# Patient Record
Sex: Female | Born: 1980 | Race: Asian | Hispanic: No | Marital: Married | Smoking: Never smoker
Health system: Southern US, Community
[De-identification: ages and names within clinical notes are randomized; demographics above are authoritative.]

## PROBLEM LIST (undated history)

## (undated) HISTORY — PX: APPENDECTOMY: SHX54

## (undated) HISTORY — PX: ECTOPIC PREGNANCY SURGERY: SHX613

---

## 2014-09-14 ENCOUNTER — Telehealth: Payer: Self-pay | Admitting: Family Medicine

## 2014-09-14 NOTE — Telephone Encounter (Signed)
Patient aware that she needs to see OBGYN for pregnancy that we do not do that here.

## 2018-06-16 DIAGNOSIS — O34219 Maternal care for unspecified type scar from previous cesarean delivery: Secondary | ICD-10-CM | POA: Insufficient documentation

## 2018-10-27 LAB — HM PAP SMEAR: HM Pap smear: NEGATIVE

## 2020-03-01 ENCOUNTER — Other Ambulatory Visit: Payer: Self-pay

## 2020-03-01 ENCOUNTER — Encounter: Payer: Self-pay | Admitting: Nurse Practitioner

## 2020-03-01 ENCOUNTER — Ambulatory Visit: Payer: Medicaid Other | Admitting: Nurse Practitioner

## 2020-03-01 VITALS — BP 97/67 | HR 69 | Temp 97.5°F | Resp 20 | Ht 64.0 in | Wt 123.0 lb

## 2020-03-01 DIAGNOSIS — H9312 Tinnitus, left ear: Secondary | ICD-10-CM

## 2020-03-01 DIAGNOSIS — R42 Dizziness and giddiness: Secondary | ICD-10-CM

## 2020-03-01 DIAGNOSIS — R202 Paresthesia of skin: Secondary | ICD-10-CM | POA: Insufficient documentation

## 2020-03-01 MED ORDER — MECLIZINE HCL 25 MG PO TABS: 25.0000 mg | ORAL_TABLET | Freq: Three times a day (TID) | ORAL | 0 refills | Status: DC | PRN

## 2020-03-01 NOTE — Assessment & Plan Note (Signed)
Concerning patient's paresthesia of the arm.  Patient is reporting that symptoms are not well controlled.  This is not new for patient and has been going on for the last 1 year.  Patient is reporting worsening symptoms today.  Symptoms are numbing and tingling sensation in her left arm shoulders to her fingers and worse at night.  Patient also is reporting weakness of bilateral upper extremities.  Education provided to patient. Labs collected today: B12, CBC, CMP, TSH, and iron ferritin. Labs pending.  Treatment will follow appropriately.

## 2020-03-01 NOTE — Assessment & Plan Note (Signed)
Patient is reporting ringing in her left ear.  This is not new for patient.  Patient has had the symptoms several years ago.  Symptoms are worse since after having her child in 2020.  Patient reports fullness and worsening ringing sensation.  On assessment copious amount of earwax in patient's ear canal.  Provided education to patient to prophylactically clean ears with Debrox over-the-counter.  Provided education to patient with printed handout on cerumen buildup and care. Patient knows to follow-up with worsening or unresolved symptoms Patient started on meclizine 25 mg daily as needed.

## 2020-03-01 NOTE — Assessment & Plan Note (Signed)
Patient is a 39 year old female who presents to clinic today with episodes of dizziness.  This is not new for patient.  Patient reports experiencing 1-2 dizzy spell in every 10 to 14 days.  Patient reports symptoms are worse since the birth of her child in January 2020.  Provided education with printed handout given to patient.  Advised patient to increase fluid, stay hydrated, rise slowly and sit slowly.  Advised patient to monitor blood pressure. Patient started on meclizine 25 mg daily as needed. Medication sent to pharmacy. Patient knows to follow-up with worsening or unresolved symptoms.

## 2020-03-01 NOTE — Progress Notes (Signed)
New Patient Office Visit  Subjective:  Patient ID: Ruth Arellano, female    DOB: 26-Dec-1980  Age: 39 y.o. MRN: [id number]  CC:  Chief Complaint  Patient presents with  . Establish Care    New pt     HPI Ruth Arellano presents for Dizziness.  This is not new for patient.  Patient reports experiencing 1-2 dizzy spells every 10 to 14 days.  Patient reports symptoms are worse since the birth of her child in January 2020.  Patient has not used any medication to alleviate symptoms.  Patient is not reporting any pain or muscle aches.  Patient denies any change in activity or strenuous exercises.   Concerning patient's Paresthesia, patient is reporting that symptoms are not well controlled.  This is not new for her and has been going on for the last year.  Patient is reporting worsening symptoms today.  Symptoms are numbing and tingling sensation in her left arm, shoulders to her fingers and worse at night.  Patient also is reporting weakness of bilateral upper extremities.  Patient is reporting ringing in her left ear.  This is not new for patient, per patient this has been going on for 12 years.  Symptoms are worse since after having her child in 2020.  Patient reports fullness and worsening ringing sensation.     Past Surgical History:  Procedure Laterality Date  . APPENDECTOMY    . CESAREAN SECTION     2   . ECTOPIC PREGNANCY SURGERY      History reviewed. No pertinent family history.  Social History   Socioeconomic History  . Marital status: Married    Spouse name: Not on file  . Number of children: 2  . Years of education: Not on file  . Highest education level: Not on file  Occupational History  . Not on file  Tobacco Use  . Smoking status: Never Smoker  . Smokeless tobacco: Never Used  Vaping Use  . Vaping Use: Never used  Substance and Sexual Activity  . Alcohol use: Never  . Drug use: Never  . Sexual activity: Not on file  Other Topics Concern  . Not on file    Social History Narrative  . Not on file   Social Determinants of Health   Financial Resource Strain:   . Difficulty of Paying Living Expenses:   Food Insecurity:   . Worried About Charity fundraiser in the Last Year:   . Arboriculturist in the Last Year:   Transportation Needs:   . Film/video editor (Medical):   Marland Kitchen Lack of Transportation (Non-Medical):   Physical Activity:   . Days of Exercise per Week:   . Minutes of Exercise per Session:   Stress:   . Feeling of Stress :   Social Connections:   . Frequency of Communication with Friends and Family:   . Frequency of Social Gatherings with Friends and Family:   . Attends Religious Services:   . Active Member of Clubs or Organizations:   . Attends Archivist Meetings:   Marland Kitchen Marital Status:   Intimate Partner Violence:   . Fear of Current or Ex-Partner:   . Emotionally Abused:   Marland Kitchen Physically Abused:   . Sexually Abused:     ROS Review of Systems  Constitutional: Negative.   HENT: Negative.        Wax build up bilateral ear  Eyes: Negative.   Respiratory: Negative.   Cardiovascular: Negative.  Gastrointestinal: Negative.   Genitourinary: Negative.   Musculoskeletal: Negative.   Skin: Negative for color change and rash.  Neurological: Positive for dizziness.    Objective:   Today's Vitals: BP 97/67   Pulse 69   Temp (!) 97.5 F (36.4 C)   Resp 20   Ht _0  (1.626 m)   Wt 123 lb (55.8 kg)   LMP 02/21/2020 (Exact Date)   SpO2 98%   BMI 21.11 kg/m   Physical Exam Constitutional:      Appearance: Normal appearance.  HENT:     Head: Normocephalic.     Comments: Copious wax build up, not impacted    Nose: Nose normal.  Eyes:     Conjunctiva/sclera: Conjunctivae normal.  Cardiovascular:     Rate and Rhythm: Normal rate and regular rhythm.     Pulses: Normal pulses.     Heart sounds: Normal heart sounds.  Pulmonary:     Effort: Pulmonary effort is normal.     Breath sounds: Normal breath  sounds.  Abdominal:     General: Bowel sounds are normal.  Skin:    General: Skin is warm.     Findings: No erythema or rash.  Neurological:     Mental Status: She is alert and oriented to person, place, and time.     Comments: Dizziness, ringing in left ear  Psychiatric:        Mood and Affect: Mood normal.        Behavior: Behavior normal.     Assessment & Plan:   Problem List Items Addressed This Visit      Other   Episode of dizziness    Patient is a 39 year old female who presents to clinic today with episodes of dizziness.  This is not new for patient.  Patient reports experiencing 1-2 dizzy spell in every 10 to 14 days.  Patient reports symptoms are worse since the birth of her child in January 2020.  Provided education with printed handout given to patient.  Advised patient to increase fluid, stay hydrated, rise slowly and sit slowly.  Advised patient to monitor blood pressure. Patient started on meclizine 25 mg daily as needed. Medication sent to pharmacy. Patient knows to follow-up with worsening or unresolved symptoms.      Relevant Medications   meclizine (ANTIVERT) 25 MG tablet   Other Relevant Orders   CBC with Differential   CMP14+EGFR   Tinnitus of left ear    Patient is reporting ringing in her left ear.  This is not new for patient.  Patient has had the symptoms several years ago.  Symptoms are worse since after having her child in 2020.  Patient reports fullness and worsening ringing sensation.  On assessment copious amount of earwax in patient's ear canal.  Provided education to patient to prophylactically clean ears with Debrox over-the-counter.  Provided education to patient with printed handout on cerumen buildup and care. Patient knows to follow-up with worsening or unresolved symptoms Patient started on meclizine 25 mg daily as needed.      Paresthesia of arm - Primary    Concerning patient's paresthesia of the arm.  Patient is reporting that symptoms  are not well controlled.  This is not new for patient and has been going on for the last 1 year.  Patient is reporting worsening symptoms today.  Symptoms are numbing and tingling sensation in her left arm shoulders to her fingers and worse at night.  Patient also is reporting weakness of  bilateral upper extremities.  Education provided to patient. Labs collected today: B12, CBC, CMP, TSH, and iron ferritin. Labs pending.  Treatment will follow appropriately.      Relevant Orders   Vitamin B12   TSH   Ferritin      Outpatient Encounter Medications as of 03/01/2020  Medication Sig  . meclizine (ANTIVERT) 25 MG tablet Take 1 tablet (25 mg total) by mouth 3 (three) times daily as needed for dizziness.   No facility-administered encounter medications on file as of 03/01/2020.    Follow-up: Return if symptoms worsen or fail to improve.   Ivy Lynn, NP

## 2020-03-01 NOTE — Patient Instructions (Addendum)
Meniere Disease  Meniere disease is an inner ear disorder. It causes attacks of a spinning sensation (vertigo), dizziness, and ringing in the ear (tinnitus). It also causes hearing loss and a feeling of fullness or pressure in the ear. This is a lifelong condition, and it may get worse over time. You may have drop attacks or severe dizziness that makes you fall. A drop attack is when you suddenly fall without losing consciousness and you quickly recover after a few seconds or minutes. What are the causes? This condition is caused by having too much of the fluid that is in your inner ear (endolymph). When fluid builds up in your inner ear, it affects the nerves that control balance and hearing. The reason for the fluid buildup is not known. Possible causes include:  Allergies.  An abnormal reaction of the body's defense system (autoimmune disease).  Viral infection of the inner ear.  Head injury. What increases the risk? You are more likely to develop this condition if:  You are older than age 40.  You have a family history of Meniere disease.  You have a history of autoimmune disease.  You have a history of migraine headaches. What are the signs or symptoms? Symptoms of this condition can come and go and may last for up to 4 hours at a time. Symptoms usually start in one ear. They may become more frequent and eventually involve both ears. Symptoms can include:  Fullness and pressure in your ear.  Roaring or ringing in your ear.  Vertigo and loss of balance.  Dizziness.  Decreased hearing.  Nausea and vomiting. How is this diagnosed? This condition is diagnosed based on:  A physical exam.  Tests , such as: ? A hearing test (audiogram). ? An electronystagmogram. This tests your balance nerve (vestibular nerve). ? Imaging studies of your inner ear, such as CT scan or MRI. ? Other balance tests, such as rotational or balance platform tests. How is this treated? There is  no cure for this condition, but treatment can help to manage your symptoms. Treatment may include:  A low-salt diet. Limiting salt may help to reduce fluid in the body and relieve symptoms.  Oral or injected medicines to reduce or control: ? Vertigo. ? Nausea. ? Fluid retention. ? Dizziness.  Use of an air pressure pulse generator. This is a machine that sends small pressure pulses into your ear canal.  Hearing aids.  Inner ear surgery. This is rare. When you have symptoms, it can be helpful to lie down on a flat surface and focus your eyes on one object that does not move. Try to stay in that position until your symptoms go away. Follow these instructions at home: Eating and drinking  Eat the same amount of food at the same time every day, including snacks.  Do not skip meals.  Avoid caffeine.  Drink enough fluids to keep your urine clear or pale yellow.  Limit alcoholic drinks to one drink a day for non-pregnant women and 2 drinks a day for men. One drink equals 12 oz of beer, 5 oz of wine, or 1 oz of hard liquor.  Limit the salt (sodium) in your diet as told by your health care provider. Check ingredients and nutrition facts on packaged foods and beverages.  Do not eat foods that contain monosodium glutamate (MSG). General instructions  Do not use any products that contain nicotine or tobacco, such as cigarettes and e-cigarettes. If you need help quitting, ask your   health care provider.  Take over-the-counter and prescription medicines only as told by your health care provider.  Find ways to reduce or avoid stress. If you need help with this, ask your health care provider.  Do not drive if you have vertigo or dizziness. Contact a health care provider if:  You have symptoms that last longer than 4 hours.  You have new or worse symptoms. Get help right away if:  You have been vomiting for 24 hours.  You cannot keep fluids down.  You have chest pain or trouble  breathing. Summary  Meniere disease is an inner ear disorder. It causes attacks of a spinning sensation (vertigo), dizziness, and ringing in the ear (tinnitus). It also causes hearing loss and a feeling of fullness or pressure in the ear.  Symptoms of this condition can come and go and may last for up to 4 hours at a time.  When you have symptoms, it can be helpful to lie down on a flat surface and focus your eyes on one object that does not move. Try to stay in that position until your symptoms go away. This information is not intended to replace advice given to you by your health care provider. Make sure you discuss any questions you have with your health care provider. Document Revised: 08/02/2017 Document Reviewed: 07/11/2016 Elsevier Patient Education  2020 [employer] Dizziness Dizziness is a common problem. It makes you feel unsteady or light-headed. You may feel like you are about to pass out (faint). Dizziness can lead to getting hurt if you stumble or fall. Dizziness can be caused by many things, including:  Medicines.  Not having enough water in your body (dehydration).  Illness. Follow these instructions at home: Eating and drinking   Drink enough fluid to keep your pee (urine) clear or pale yellow. This helps to keep you from getting dehydrated. Try to drink more clear fluids, such as water.  Do not drink alcohol.  Limit how much caffeine you drink or eat, if your doctor tells you to do that.  Limit how much salt (sodium) you drink or eat, if your doctor tells you to do that. Activity   Avoid making quick movements. ? When you stand up from sitting in a chair, steady yourself until you feel okay. ? In the morning, first sit up on the side of the bed. When you feel okay, stand slowly while you hold onto something. Do this until you know that your balance is fine.  If you need to stand in one place for a long time, move your legs often. Tighten and relax the muscles  in your legs while you are standing.  Do not drive or use heavy machinery if you feel dizzy.  Avoid bending down if you feel dizzy. Place items in your home so you can reach them easily without leaning over. Lifestyle  Do not use any products that contain nicotine or tobacco, such as cigarettes and e-cigarettes. If you need help quitting, ask your doctor.  Try to lower your stress level. You can do this by using methods such as yoga or meditation. Talk with your doctor if you need help. General instructions  Watch your dizziness for any changes.  Take over-the-counter and prescription medicines only as told by your doctor. Talk with your doctor if you think that you are dizzy because of a medicine that you are taking.  Tell a friend or a family member that you are feeling dizzy. If he or  she notices any changes in your behavior, have this person call your doctor.  Keep all follow-up visits as told by your doctor. This is important. Contact a doctor if:  Your dizziness does not go away.  Your dizziness or light-headedness gets worse.  You feel sick to your stomach (nauseous).  You have trouble hearing.  You have new symptoms.  You are unsteady on your feet.  You feel like the room is spinning. Get help right away if:  You throw up (vomit) or have watery poop (diarrhea), and you cannot eat or drink anything.  You have trouble: ? Talking. ? Walking. ? Swallowing. ? Using your arms, hands, or legs.  You feel generally weak.  You are not thinking clearly, or you have trouble forming sentences. A friend or family member may notice this.  You have: ? Chest pain. ? Pain in your belly (abdomen). ? Shortness of breath. ? Sweating.  Your vision changes.  You are bleeding.  You have a very bad headache.  You have neck pain or a stiff neck.  You have a fever. These symptoms may be an emergency. Do not wait to see if the symptoms will go away. Get medical help right  away. Call your local emergency services (911 in the [place] Do not drive yourself to the hospital. Summary  Dizziness makes you feel unsteady or light-headed. You may feel like you are about to pass out (faint).  Drink enough fluid to keep your pee (urine) clear or pale yellow. Do not drink alcohol.  Avoid making quick movements if you feel dizzy.  Watch your dizziness for any changes. This information is not intended to replace advice given to you by your health care provider. Make sure you discuss any questions you have with your health care provider. Document Revised: 08/23/2017 Document Reviewed: 09/06/2016 Elsevier Patient Education  2020 [employer] Paresthesia Paresthesia is a burning or prickling feeling. This feeling can happen in any part of the body. It often happens in the hands, arms, legs, or feet. Usually, it is not painful. In most cases, the feeling goes away in a short time and is not a sign of a serious problem. If you have paresthesia that lasts a long time, you may need to be seen by your doctor. Follow these instructions at home: Alcohol use   Do not drink alcohol if: ? Your doctor tells you not to drink. ? You are pregnant, may be pregnant, or are planning to become pregnant.  If you drink alcohol: ? Limit how much you use to:  0-1 drink a day for women.  0-2 drinks a day for men. ? Be aware of how much alcohol is in your drink. In the [place] one drink equals one 12 oz bottle of beer (355 mL), one 5 oz glass of wine (148 mL), or one 1 oz glass of hard liquor (44 mL). Nutrition   Eat a healthy diet. This includes: ? Eating foods that have a lot of fiber in them, such as fresh fruits and vegetables, whole grains, and beans. ? Limiting foods that have a lot of fat and processed sugars in them, such as fried or sweet foods. General instructions  Take over-the-counter and prescription medicines only as told by your doctor.  Do not use any products that  have nicotine or tobacco in them, such as cigarettes and e-cigarettes. If you need help quitting, ask your doctor.  If you have diabetes, work with your doctor to make sure  your blood sugar stays in a healthy range.  If your feet feel numb: ? Check for redness, warmth, and swelling every day. ? Wear padded socks and comfortable shoes. These help protect your feet.  Keep all follow-up visits as told by your doctor. This is important. Contact a doctor if:  You have paresthesia that gets worse or does not go away.  Your burning or prickling feeling gets worse when you walk.  You have pain or cramps.  You feel dizzy.  You have a rash. Get help right away if you:  Feel weak.  Have trouble walking or moving.  Have problems speaking, understanding, or seeing.  Feel confused.  Cannot control when you pee (urinate) or poop (have a bowel movement).  Lose feeling (have numbness) after an injury.  Have new weakness in an arm or leg.  Pass out (faint). Summary  Paresthesia is a burning or prickling feeling. It often happens in the hands, arms, legs, or feet.  In most cases, the feeling goes away in a short time and is not a sign of a serious problem.  If you have paresthesia that lasts a long time, you may need to be seen by your doctor. This information is not intended to replace advice given to you by your health care provider. Make sure you discuss any questions you have with your health care provider. Document Revised: 09/15/2018 Document Reviewed: 08/29/2017 Elsevier Patient Education  2020 [employer].

## 2020-03-02 LAB — VITAMIN B12: Vitamin B-12: 897 pg/mL (ref 232–1245)

## 2020-03-02 LAB — CBC WITH DIFFERENTIAL/PLATELET
Basophils Absolute: 0 10*3/uL (ref 0.0–0.2)
Basos: 1 %
EOS (ABSOLUTE): 0.1 10*3/uL (ref 0.0–0.4)
Eos: 2 %
Hematocrit: 38.2 % (ref 34.0–46.6)
Hemoglobin: 12.8 g/dL (ref 11.1–15.9)
Immature Grans (Abs): 0 10*3/uL (ref 0.0–0.1)
Immature Granulocytes: 0 %
Lymphocytes Absolute: 2.1 10*3/uL (ref 0.7–3.1)
Lymphs: 48 %
MCH: 31.4 pg (ref 26.6–33.0)
MCHC: 33.5 g/dL (ref 31.5–35.7)
MCV: 94 fL (ref 79–97)
Monocytes Absolute: 0.3 10*3/uL (ref 0.1–0.9)
Monocytes: 6 %
Neutrophils Absolute: 1.9 10*3/uL (ref 1.4–7.0)
Neutrophils: 43 %
Platelets: 242 10*3/uL (ref 150–450)
RBC: 4.08 x10E6/uL (ref 3.77–5.28)
RDW: 12.2 % (ref 11.7–15.4)
WBC: 4.5 10*3/uL (ref 3.4–10.8)

## 2020-03-02 LAB — CMP14+EGFR
ALT: 18 IU/L (ref 0–32)
AST: 20 IU/L (ref 0–40)
Albumin/Globulin Ratio: 1.6 (ref 1.2–2.2)
Albumin: 4.6 g/dL (ref 3.8–4.8)
Alkaline Phosphatase: 38 IU/L — ABNORMAL LOW (ref 48–121)
BUN/Creatinine Ratio: 21 (ref 9–23)
BUN: 14 mg/dL (ref 6–20)
Bilirubin Total: 0.5 mg/dL (ref 0.0–1.2)
CO2: 25 mmol/L (ref 20–29)
Calcium: 9.4 mg/dL (ref 8.7–10.2)
Chloride: 103 mmol/L (ref 96–106)
Creatinine, Ser: 0.67 mg/dL (ref 0.57–1.00)
GFR calc Af Amer: 129 mL/min/{1.73_m2} (ref >59)
GFR calc non Af Amer: 112 mL/min/{1.73_m2} (ref >59)
Globulin, Total: 2.8 g/dL (ref 1.5–4.5)
Glucose: 89 mg/dL (ref 65–99)
Potassium: 4.4 mmol/L (ref 3.5–5.2)
Sodium: 139 mmol/L (ref 134–144)
Total Protein: 7.4 g/dL (ref 6.0–8.5)

## 2020-03-02 LAB — FERRITIN: Ferritin: 77 ng/mL (ref 15–150)

## 2020-03-02 LAB — TSH: TSH: 0.953 u[IU]/mL (ref 0.450–4.500)

## 2020-03-23 ENCOUNTER — Encounter: Payer: Self-pay | Admitting: *Deleted

## 2020-10-27 ENCOUNTER — Ambulatory Visit: Payer: Medicaid Other | Admitting: Family Medicine

## 2020-10-27 ENCOUNTER — Other Ambulatory Visit: Payer: Self-pay

## 2020-10-27 ENCOUNTER — Encounter: Payer: Self-pay | Admitting: Family Medicine

## 2020-10-27 VITALS — BP 108/67 | HR 95 | Temp 98.0°F | Ht 64.0 in | Wt 122.5 lb

## 2020-10-27 DIAGNOSIS — R200 Anesthesia of skin: Secondary | ICD-10-CM

## 2020-10-27 DIAGNOSIS — G5601 Carpal tunnel syndrome, right upper limb: Secondary | ICD-10-CM | POA: Diagnosis not present

## 2020-10-27 MED ORDER — PREDNISONE 20 MG PO TABS: 40.0000 mg | ORAL_TABLET | Freq: Every day | ORAL | 0 refills | Status: AC

## 2020-10-27 NOTE — Progress Notes (Signed)
Established Patient Office Visit  Subjective:  Patient ID: Ruth Arellano, female    DOB: 06-05-1981  Age: 40 y.o. MRN: [id number]  CC:  Chief Complaint  Patient presents with  . Numbness   Interpreter Ruth Arellano #[id number]  HPI Ruth Arellano presents for paresthesia of bilateral hands. The has been going on for about 10 years. It has been intermittent. It seems to be worse in the summer and better in the winter.  It is worse at night and early in the morning. She reports that her symptoms are only currently in her right hand right now. The pain and numbness occurs in all of her hand. 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 The symptoms have gotten worse over the years. She has not tried anything for her symptoms. She had a visit with normal lab work in September. She feels like her hands are not as strong as they were. Denies injury, swelling, decreased ROM.   History reviewed. No pertinent past medical history.  Past Surgical History:  Procedure Laterality Date  . APPENDECTOMY    . CESAREAN SECTION     2   . ECTOPIC PREGNANCY SURGERY      History reviewed. No pertinent family history.  Social History   Socioeconomic History  . Marital status: Married    Spouse name: Not on file  . Number of children: 2  . Years of education: Not on file  . Highest education level: Not on file  Occupational History  . Not on file  Tobacco Use  . Smoking status: Never Smoker  . Smokeless tobacco: Never Used  Vaping Use  . Vaping Use: Never used  Substance and Sexual Activity  . Alcohol use: Never  . Drug use: Never  . Sexual activity: Not on file  Other Topics Concern  . Not on file  Social History Narrative  . Not on file   Social Determinants of Health   Financial Resource Strain: Not on file  Food Insecurity: Not on file  Transportation Needs: Not on file  Physical Activity: Not on file  Stress: Not on file  Social Connections: Not on file  Intimate Partner Violence: Not  on file    Outpatient Medications Prior to Visit  Medication Sig Dispense Refill  . meclizine (ANTIVERT) 25 MG tablet Take 1 tablet (25 mg total) by mouth 3 (three) times daily as needed for dizziness. 60 tablet 0   No facility-administered medications prior to visit.    No Known Allergies  ROS Review of Systems As per HPI.    Objective:    Physical Exam Vitals and nursing note reviewed.  Constitutional:      Appearance: Normal appearance.  HENT:     Head: Normocephalic and atraumatic.  Pulmonary:     Effort: Pulmonary effort is normal. No respiratory distress.  Musculoskeletal:     Right forearm: Normal.     Left forearm: Normal.     Right wrist: Normal.     Left wrist: Normal.     Right hand: No swelling, deformity, tenderness or bony tenderness. Normal range of motion. Decreased strength.     Left hand: No swelling, deformity, tenderness or bony tenderness. Normal range of motion. Decreased strength.     Right lower leg: No edema.     Left lower leg: No edema.     Comments: Decreased grip strength bilaterally. +phalen's test on right wrist.   Skin:    General: Skin is warm and dry.  Neurological:  General: No focal deficit present.     Mental Status: She is alert and oriented to person, place, and time.  Psychiatric:        Mood and Affect: Mood normal.        Behavior: Behavior normal.        Thought Content: Thought content normal.        Judgment: Judgment normal.     BP 108/67   Pulse 95   Temp 98 F (36.7 C) (Temporal)   Ht 5\' 4"  (1.626 m)   Wt 122 lb 8 oz (55.6 kg)   BMI 21.03 kg/m  Wt Readings from Last 3 Encounters:  10/27/20 122 lb 8 oz (55.6 kg)  03/01/20 123 lb (55.8 kg)     There are no preventive care reminders to display for this patient.  There are no preventive care reminders to display for this patient.  Lab Results  Component Value Date   TSH 0.953 03/01/2020   Lab Results  Component Value Date   WBC 4.5 03/01/2020    HGB 12.8 03/01/2020   HCT 38.2 03/01/2020   MCV 94 03/01/2020   PLT 242 03/01/2020   Lab Results  Component Value Date   NA 139 03/01/2020   K 4.4 03/01/2020   CO2 25 03/01/2020   GLUCOSE 89 03/01/2020   BUN 14 03/01/2020   CREATININE 0.67 03/01/2020   BILITOT 0.5 03/01/2020   ALKPHOS 38 (L) 03/01/2020   AST 20 03/01/2020   ALT 18 03/01/2020   PROT 7.4 03/01/2020   ALBUMIN 4.6 03/01/2020   CALCIUM 9.4 03/01/2020   No results found for: CHOL No results found for: HDL No results found for: LDLCALC No results found for: TRIG No results found for: CHOLHDL No results found for: 03/03/2020    Assessment & Plan:   Amberia was seen today for numbness.  Diagnoses and all orders for this visit:  Bilateral hand numbness Chronic with recent worsening. Previously normal lab work. Likely carpal tunnel bilaterally. + phalen's on right wrist only today. Prednisone burst ordered. Discussed wrist splints, ibuprofen. Referral placed to ortho for further evaluation.   -     predniSONE (DELTASONE) 20 MG tablet; Take 2 tablets (40 mg total) by mouth daily with breakfast for 5 days. -     Ambulatory referral to Orthopedic Surgery  Carpal tunnel syndrome of right wrist -     Ambulatory referral to Orthopedic Surgery  Follow-up: Return if symptoms worsen or fail to improve.   The patient indicates understanding of these issues and agrees with the plan.  [id number], FNP

## 2020-11-09 ENCOUNTER — Other Ambulatory Visit: Payer: Self-pay

## 2020-11-09 ENCOUNTER — Ambulatory Visit: Payer: Self-pay | Admitting: Orthopaedic Surgery

## 2020-11-09 ENCOUNTER — Ambulatory Visit: Payer: Medicaid Other | Admitting: Orthopaedic Surgery

## 2020-11-23 ENCOUNTER — Ambulatory Visit (INDEPENDENT_AMBULATORY_CARE_PROVIDER_SITE_OTHER): Payer: Medicaid Other | Admitting: Orthopaedic Surgery

## 2020-11-23 ENCOUNTER — Other Ambulatory Visit: Payer: Self-pay

## 2020-11-23 ENCOUNTER — Encounter: Payer: Self-pay | Admitting: Orthopaedic Surgery

## 2020-11-23 ENCOUNTER — Ambulatory Visit (INDEPENDENT_AMBULATORY_CARE_PROVIDER_SITE_OTHER): Payer: Medicaid Other

## 2020-11-23 DIAGNOSIS — R202 Paresthesia of skin: Secondary | ICD-10-CM

## 2020-11-23 DIAGNOSIS — M542 Cervicalgia: Secondary | ICD-10-CM

## 2020-11-23 NOTE — Progress Notes (Signed)
Office Visit Note   Patient: Ruth Arellano           Date of Birth: Aug 27, 1981           MRN: [id number] Visit Date: 11/23/2020              Requested by: Gabriel Earing, FNP [address] [place],  [place] [address] PCP: Gwenlyn Fudge, FNP   Assessment & Plan: Visit Diagnoses:  1. Neck pain   2. Paresthesia of arm     Plan: 40 year old [place] female with symptoms of carpal tunnel based on her numbness and tingling particularly at night.  Difficult evaluation and exam as the patient does not speak any English and and we had used interpreter by phone.  She had minimally positive Tinel's over the median nerve but good opposition of thumb the little finger.  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  No history of injury or trauma.  In addition she has had some stiffness of her cervical spine with x-rays demonstrating straightening of the normal cervical lordosis.  I will place her in a volar wrist splint that I would like her to use as much as she can during the day and particularly at night and try a course of physical therapy at [facility] for both the carpal tunnel and the cervical spine.  Reevaluate in 1 month and if no improvement consider EMGs and nerve conduction studies.  Follow-Up Instructions: Return in about 1 month (around 12/24/2020).   Orders:  Orders Placed This Encounter  Procedures  . XR Cervical Spine 2 or 3 views  . Ambulatory referral to Physical Therapy   No orders of the defined types were placed in this encounter.     Procedures: No procedures performed   Clinical Data: No additional findings.   Subjective: Chief Complaint  Patient presents with  . Left Shoulder - Pain  . Right Shoulder - Pain  . Left Hand - Pain  . Right Hand - Pain  Patient presents today for bilateral shoulder and hand pain. She said that this has been going on for "a long time". She has pain in the posterior aspect of her neck all the time. The pain is worse with  looking up or tucking her chin. She has pain that radiates into both shoulders, but worse on the left side. She also states that she has pain that radiates down her left arm. She has numbness in both hands, but worse on the right. The numbness seems to occur more at night while sleeping. She is right hand dominant. Her numbness in the hands started three months after delivering her first child. 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 She takes over the counter medicine.  Relates having a little problem with her hands after her second pregnancy.  She seems to separate the issue with her neck and her hand.  Right-hand-dominant. Entire evaluation and exam was performed with the aid of an interpreter by phone as the patient speaks no English  HPI  Review of Systems   Objective: Vital Signs: There were no vitals taken for this visit.  Physical Exam Constitutional:      Appearance: She is well-developed.  Eyes:     Pupils: Pupils are equal, round, and reactive to light.  Pulmonary:     Effort: Pulmonary effort is normal.  Skin:    General: Skin is warm and dry.  Neurological:  Mental Status: She is alert and oriented to person, place, and time.  Psychiatric:        Behavior: Behavior normal.     Ortho Exam essentially normal motion of the cervical spine with normal flexion chin to chest.  A little bit of discomfort the posterior cervical region with flexion and extension but no referred pain to either shoulder.  Minimally positive Tinel's over the median nerve.  Full range of motion of her fingers without any swelling.  Skin intact.  Good opposition of thumb the little finger.  No obvious atrophy of the intrinsic muscles.  Pulses intact  little if any change with thoracic outlet testing. Specialty Comments:  No specialty comments available.  Imaging: XR Cervical Spine 2 or 3 views  Result Date: 11/23/2020 2 views of the cervical spine were obtained demonstrating  straightening of the normal cervical lordosis.  No evidence of a listhesis or scoliosis.  Facet joints intact.  No acute changes.  Some prominence of the lateral processes of C7 consistent with minimal cervical rib    PMFS History: Patient Active Problem List   Diagnosis Date Noted  . Neck pain 11/23/2020  . Episode of dizziness 03/01/2020  . 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 Paresthesia of arm 03/01/2020  . Previous cesarean delivery affecting pregnancy, antepartum 06/16/2018   History reviewed. No pertinent past medical history.  History reviewed. No pertinent family history.  Past Surgical History:  Procedure Laterality Date  . APPENDECTOMY    . CESAREAN SECTION     2   . ECTOPIC PREGNANCY SURGERY     Social History   Occupational History  . Not on file  Tobacco Use  . Smoking status: Never Smoker  . Smokeless tobacco: Never Used  Vaping Use  . Vaping Use: Never used  Substance and Sexual Activity  . Alcohol use: Never  . Drug use: Never  . Sexual activity: Not on file

## 2020-12-01 ENCOUNTER — Other Ambulatory Visit: Payer: Self-pay

## 2020-12-01 ENCOUNTER — Ambulatory Visit: Payer: Medicaid Other | Attending: Orthopaedic Surgery | Admitting: Physical Therapy

## 2020-12-01 ENCOUNTER — Encounter: Payer: Self-pay | Admitting: Physical Therapy

## 2020-12-01 DIAGNOSIS — M5412 Radiculopathy, cervical region: Secondary | ICD-10-CM | POA: Insufficient documentation

## 2020-12-01 DIAGNOSIS — M6281 Muscle weakness (generalized): Secondary | ICD-10-CM | POA: Diagnosis present

## 2020-12-01 DIAGNOSIS — M542 Cervicalgia: Secondary | ICD-10-CM | POA: Diagnosis not present

## 2020-12-01 NOTE — Therapy (Signed)
[facility] [address] [place], [place], [address] Phone: [phone]   Fax:  [phone]  Physical Therapy Evaluation  Patient Details  Name: Ruth Arellano MRN: [id number] Date of Birth: Dec 11, 1980 Referring Provider (PT): Norlene Campbell, MD   Encounter Date: 12/01/2020   PT End of Session - 12/01/20 2110    Visit Number 1    Number of Visits 12    Date for PT Re-Evaluation 01/19/21    Authorization Type Medicaid Healthy [employer] (No modalities) ; Progress note every 10th visit    PT Start Time 0815    PT Stop Time 0858    PT Time Calculation (min) 43 min    Activity Tolerance Patient tolerated treatment well    Behavior During Therapy [facility] for tasks assessed/performed           History reviewed. No pertinent past medical history.  Past Surgical History:  Procedure Laterality Date  . APPENDECTOMY    . CESAREAN SECTION     2   . ECTOPIC PREGNANCY SURGERY      There were no vitals filed for this visit.    Subjective Assessment - 12/01/20 2046    Subjective COVID-19 screening performed upon arrival. Patient arrives to physical therapy with a history of L UT pain, numbness and tingling to all fingers, and loss of strength that exacerbated about 2 weeks ago. Patient reports ability to perform all ADLs independently with minimal pain. Patient reports L arm goes numb at night and limit her ability to sleep restfully. Patient's pain at worst rated at 10/10 and pain at best as 1-2/10. Patient's goals are to decrease pain, fix arm, and get better.    Patient is accompained by: Clinical cytogeneticist via [employer] language line   Limitations House hold activities    Patient Stated Goals stop arm pain    Currently in Pain? Yes    Pain Score 8     Pain Location Arm    Pain Orientation Left    Pain Descriptors / Indicators Numbness    Pain Type Acute pain    Pain Radiating Towards Left fingers    Pain Onset 1 to 4 weeks ago    Pain  Frequency Intermittent    Aggravating Factors  sleeping, at night    Pain Relieving Factors nothing    Effect of Pain on Daily Activities minimal              [facility] PT Assessment - 12/01/20 0001      Assessment   Medical Diagnosis Neck pain, paresthesia of arm    Referring Provider (PT) Norlene Campbell, MD    Onset Date/Surgical Date --   exacerbation two weeks ago   Hand Dominance Right    Prior Therapy no      Precautions   Precautions None      Restrictions   Weight Bearing Restrictions No      Balance Screen   Has the patient fallen in the past 6 months No    Has the patient had a decrease in activity level because of a fear of falling?  No    Is the patient reluctant to leave their home because of a fear of falling?  No      Home Tourist information centre manager residence      Prior Function   Level of Independence Independent      Sensation   Light Touch Appears Intact  Posture/Postural Control   Posture/Postural Control Postural limitations    Postural Limitations Rounded Shoulders;Forward head      Deep Tendon Reflexes   DTR Assessment Site Biceps;Brachioradialis;Triceps    Biceps DTR 2+    Brachioradialis DTR 2+    Triceps DTR 2+      ROM / Strength   AROM / PROM / Strength AROM;Strength      AROM   Overall AROM  Deficits;Due to pain    AROM Assessment Site Cervical    Cervical Flexion 41    Cervical Extension 25    Cervical - Right Side Bend 17    Cervical - Left Side Bend 20    Cervical - Right Rotation 62    Cervical - Left Rotation 44      Strength   Strength Assessment Site Shoulder;Hand    Right/Left Shoulder Right;Left    Right Shoulder Flexion 4-/5    Right Shoulder ABduction 4-/5    Right Shoulder Internal Rotation 4-/5    Right Shoulder External Rotation 4-/5    Left Shoulder Flexion 3+/5    Left Shoulder ABduction 3+/5    Left Shoulder Internal Rotation 3+/5    Left Shoulder External Rotation 3+/5    Right/Left  hand Right;Left    Right Hand Grip (lbs) 5    Left Hand Grip (lbs) 5      Palpation   Palpation comment increased tenderness to palpation to L UT with increased tone                      Objective measurements completed on examination: See above findings.               PT Education - 12/01/20 2106    Education Details chin tucks, shoulder rolls, scapular retraction, cervical mobilization    Person(s) Educated Patient    Methods Explanation;Demonstration;Handout    Comprehension Verbalized understanding            PT Short Term Goals - 12/01/20 2148      PT SHORT TERM GOAL #1   Title Patient will be independent with HEP    Baseline no knowledge of HEP    Time 3    Period Weeks    Status New             PT Long Term Goals - 12/01/20 2150      PT LONG TERM GOAL #1   Title Patient will be independent with advanced HEP    Baseline no knowledge of HEP    Time 6    Period Weeks    Status New      PT LONG TERM GOAL #2   Title Patient will demonstrate 4/5 or greater left UE MMT in all planes to improve stability during functional tasks    Baseline 3+/5    Time 6    Period Weeks    Status New      PT LONG TERM GOAL #3   Title Patient will report an elimination of left UE neurological symptoms to improve sleeping at night    Baseline left UE symptoms to finger tips    Time 6    Period Weeks    Status New                  Plan - 12/01/20 2111    Clinical Impression Statement Patient is a 40 year old right handed female who presents to physical therapy with left UE  pain, cervical pain, and decreased UE MMT. Patient requires mandarin language interpreter and medical history interpreted via [employer] language line. Patient noted with increased L UT tone upon palpation. Normal sensation to left UE. Normal UE left DTR. Patient noted with rounded shoulders and forward head. Patient and PT discussed HEP and plan of care to which patient reported  understanding. Patient would benefit from skilled physical therapy to address deficitis and address patient's goals.    Personal Factors and Comorbidities Time since onset of injury/illness/exacerbation    Examination-Activity Limitations Sleep    Stability/Clinical Decision Making Stable/Uncomplicated    Clinical Decision Making Low    Rehab Potential Fair    PT Frequency 2x / week    PT Duration 6 weeks    PT Treatment/Interventions Therapeutic activities;Therapeutic exercise;Manual techniques;Neuromuscular re-education;Passive range of motion;Patient/family education;ADLs/Self Care Home Management;Ultrasound    PT Next Visit Plan UBE, postural exercises, cervical ROM, ultrasound to L UT; no traction, e-stim, cold pack, iontophoresis, or vaso    PT Home Exercise Plan see patient education section    Consulted and Agree with Plan of Care Patient           Patient will benefit from skilled therapeutic intervention in order to improve the following deficits and impairments:  Decreased range of motion,Decreased activity tolerance,Pain,Postural dysfunction,Decreased strength,Decreased mobility  Visit Diagnosis: Cervicalgia - Plan: PT plan of care cert/re-cert  Radiculopathy, cervical region - Plan: PT plan of care cert/re-cert  Muscle weakness (generalized) - Plan: PT plan of care cert/re-cert     Problem List Patient Active Problem List   Diagnosis Date Noted  . Neck pain 11/23/2020  . Episode of dizziness 03/01/2020  . 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 Paresthesia of arm 03/01/2020  . Previous cesarean delivery affecting pregnancy, antepartum 06/16/2018    Guss Bunde, PT, DPT 12/01/2020, 10:18 PM  [facility] [address] [place], [place], [address] Phone: [phone]   Fax:  [phone]  Name: Alvina Strother MRN: [id number] Date of Birth: 07-11-1981

## 2020-12-13 ENCOUNTER — Ambulatory Visit (HOSPITAL_COMMUNITY): Payer: Medicaid Other | Admitting: Physical Therapy

## 2020-12-21 ENCOUNTER — Ambulatory Visit (INDEPENDENT_AMBULATORY_CARE_PROVIDER_SITE_OTHER): Payer: Medicaid Other | Admitting: Orthopaedic Surgery

## 2020-12-21 ENCOUNTER — Encounter: Payer: Self-pay | Admitting: Orthopaedic Surgery

## 2020-12-21 ENCOUNTER — Other Ambulatory Visit: Payer: Self-pay

## 2020-12-21 VITALS — Ht 64.0 in | Wt 122.0 lb

## 2020-12-21 DIAGNOSIS — R202 Paresthesia of skin: Secondary | ICD-10-CM

## 2020-12-21 DIAGNOSIS — M542 Cervicalgia: Secondary | ICD-10-CM | POA: Diagnosis not present

## 2020-12-21 NOTE — Progress Notes (Signed)
Office Visit Note   Patient: Ruth Arellano           Date of Birth: Jan 13, 1981           MRN: [id number] Visit Date: 12/21/2020              Requested by: Gwenlyn Fudge, FNP [address] [place],  [place] [address] PCP: Gwenlyn Fudge, FNP   Assessment & Plan: Visit Diagnoses:  1. Paresthesia of arm   2. Neck pain     Plan: Right sided paresthesias and tingling into her right hand seem to have abated significantly.  This could be a combination of time and wearing the volar wrist splint.  She has yet to get physical therapy for cervical spine but has it scheduled.  Still having symptoms of numbness and tingling particularly at night in her left hand.  His symptoms appear to consistent with carpal tunnel.  Will apply volar wrist splint on the left hand and have her follow-up with physical therapy and return in 1 month.  Consider EMGs and nerve conduction studies if no improvement.  Follow-Up Instructions: Return in about 1 month (around 01/20/2021).   Orders:  No orders of the defined types were placed in this encounter.  No orders of the defined types were placed in this encounter.     Procedures: No procedures performed   Clinical Data: No additional findings.   Subjective: Chief Complaint  Patient presents with  . Right Wrist - Follow-up  . Neck - Follow-up  Patient presents today for follow up on her right wrist and neck. She said that she has numbness down her left arm, and still sore. She states that her right wrist has improved and is not wearing the velcro brace.  Having symptoms predominantly in the left side.  Experiencing some neck pain but difficult to know if the numbness and tingling in originates proximally or distally.  She is having trouble at nigh with her left hand "going numb".  She is accompanied by an interpreter today.  Also relates having some issues with numbness and tingling during her pregnancies in the past  HPI  Review of  Systems   Objective: Vital Signs: Ht 5\' 4"  (1.626 m)   Wt 122 lb (55.3 kg)   BMI 20.94 kg/m   Physical Exam Constitutional:      Appearance: She is well-developed.  Eyes:     Pupils: Pupils are equal, round, and reactive to light.  Pulmonary:     Effort: Pulmonary effort is normal.  Skin:    General: Skin is warm and dry.  Neurological:     Mental Status: She is alert and oriented to person, place, and time.  Psychiatric:        Behavior: Behavior normal.     Ortho Exam awake alert and oriented x3.  Comfortable sitting.  Has a little bit of neck stiffness posteriorly with motion of her neck but no loss of motion and no referred pain to either upper extremity.  Had a mild Tinel's over the median nerve at the left wrist.  Good opposition of thumb the little finger.  Full grip and release.  Reflexes left upper extremity intact  Specialty Comments:  No specialty comments available.  Imaging: No results found.   PMFS History: Patient Active Problem List   Diagnosis Date Noted  . Neck pain 11/23/2020  . Episode of dizziness 03/01/2020  . 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 Paresthesia of arm  03/01/2020  . Previous cesarean delivery affecting pregnancy, antepartum 06/16/2018   History reviewed. No pertinent past medical history.  History reviewed. No pertinent family history.  Past Surgical History:  Procedure Laterality Date  . APPENDECTOMY    . CESAREAN SECTION     2   . ECTOPIC PREGNANCY SURGERY     Social History   Occupational History  . Not on file  Tobacco Use  . Smoking status: Never Smoker  . Smokeless tobacco: Never Used  Vaping Use  . Vaping Use: Never used  Substance and Sexual Activity  . Alcohol use: Never  . Drug use: Never  . Sexual activity: Not on file

## 2020-12-26 ENCOUNTER — Other Ambulatory Visit: Payer: Self-pay

## 2020-12-26 ENCOUNTER — Ambulatory Visit: Payer: Medicaid Other | Attending: Orthopaedic Surgery | Admitting: Physical Therapy

## 2020-12-26 ENCOUNTER — Encounter: Payer: Self-pay | Admitting: Physical Therapy

## 2020-12-26 DIAGNOSIS — M542 Cervicalgia: Secondary | ICD-10-CM | POA: Diagnosis not present

## 2020-12-26 DIAGNOSIS — M6281 Muscle weakness (generalized): Secondary | ICD-10-CM | POA: Insufficient documentation

## 2020-12-26 DIAGNOSIS — M5412 Radiculopathy, cervical region: Secondary | ICD-10-CM | POA: Insufficient documentation

## 2020-12-26 NOTE — Therapy (Signed)
[facility] [address] [place], [place], [address] Phone: [phone]   Fax:  [phone]  Physical Therapy Treatment  Patient Details  Name: Ruth Arellano MRN: [id number] Date of Birth: 1981/04/17 Referring Provider (PT): Norlene Campbell, MD   Encounter Date: 12/26/2020   PT End of Session - 12/26/20 0820    Visit Number 2    Number of Visits 12    Date for PT Re-Evaluation 01/19/21    Authorization Type Medicaid Healthy Blue (No modalities) ; Progress note every 10th visit    PT Start Time 0819    PT Stop Time 0900    PT Time Calculation (min) 41 min    Activity Tolerance Patient tolerated treatment well    Behavior During Therapy [facility] for tasks assessed/performed           History reviewed. No pertinent past medical history.  Past Surgical History:  Procedure Laterality Date  . APPENDECTOMY    . CESAREAN SECTION     2   . ECTOPIC PREGNANCY SURGERY      There were no vitals filed for this visit.   Subjective Assessment - 12/26/20 0820    Subjective COVID-19 screening performed upon arrival. Denies any cervical pain or numbness in UE upon arrival.    Limitations House hold activities    Patient Stated Goals stop arm pain    Currently in Pain? No/denies              [facility] PT Assessment - 12/26/20 0001      Assessment   Medical Diagnosis Neck pain, paresthesia of arm    Referring Provider (PT) Norlene Campbell, MD    Hand Dominance Right                         [facility] Adult PT Treatment/Exercise - 12/26/20 0001      Exercises   Exercises Neck;Shoulder      Neck Exercises: Machines for Strengthening   UBE (Upper Arm Bike) 120 RPM x8 min (forward/backward)      Neck Exercises: Standing   Wall Push Ups 15 reps    Other Standing Exercises L UT stretch 3x30 sec      Shoulder Exercises: Seated   Horizontal ABduction Strengthening;Both;20 reps;Theraband    Theraband Level (Shoulder Horizontal  ABduction) Level 1 (Yellow)    External Rotation Strengthening;Both;20 reps;Theraband    Theraband Level (Shoulder External Rotation) Level 1 (Yellow)      Shoulder Exercises: Standing   Extension Strengthening;Both;20 reps;Theraband    Theraband Level (Shoulder Extension) Level 1 (Yellow)    Row Strengthening;Both;20 reps;Theraband    Theraband Level (Shoulder Row) Level 1 (Yellow)      Shoulder Exercises: ROM/Strengthening   "W" Arms x20 reps    X to V Arms X to V x20 reps      Shoulder Exercises: Stretch   Corner Stretch 3 reps;20 seconds    Table Stretch - Flexion 3 reps;20 seconds      Manual Therapy   Manual Therapy Soft tissue mobilization    Soft tissue mobilization STW to L UT, levator scapula, cervical paraspinals to reduce tone                    PT Short Term Goals - 12/01/20 2148      PT SHORT TERM GOAL #1   Title Patient will be independent with HEP    Baseline no knowledge of HEP  Time 3    Period Weeks    Status New             PT Long Term Goals - 12/01/20 2150      PT LONG TERM GOAL #1   Title Patient will be independent with advanced HEP    Baseline no knowledge of HEP    Time 6    Period Weeks    Status New      PT LONG TERM GOAL #2   Title Patient will demonstrate 4/5 or greater left UE MMT in all planes to improve stability during functional tasks    Baseline 3+/5    Time 6    Period Weeks    Status New      PT LONG TERM GOAL #3   Title Patient will report an elimination of left UE neurological symptoms to improve sleeping at night    Baseline left UE symptoms to finger tips    Time 6    Period Weeks    Status New                 Plan - 12/26/20 0240    Clinical Impression Statement Patient presented in clinic with reports of no cervical pain or radiculopathy. Patient guided through light stretching and postural strengthening with only reports of muscle fatigue. No complaints of pain during today's session. All  communication was either verbal as patient knows some english or via iPhone translation app. Palpable tone in L UT and cervical paraspinals musculature. Patient encouraged to continue HEP as directed. No complaints following end of treatment.    Personal Factors and Comorbidities Time since onset of injury/illness/exacerbation    Examination-Activity Limitations Sleep    Stability/Clinical Decision Making Stable/Uncomplicated    Rehab Potential Fair    PT Frequency 2x / week    PT Duration 6 weeks    PT Treatment/Interventions Therapeutic activities;Therapeutic exercise;Manual techniques;Neuromuscular re-education;Passive range of motion;Patient/family education;ADLs/Self Care Home Management;Ultrasound    PT Next Visit Plan UBE, postural exercises, cervical ROM, ultrasound to L UT; no traction, e-stim, cold pack, iontophoresis, or vaso    PT Home Exercise Plan see patient education section    Consulted and Agree with Plan of Care Patient           Patient will benefit from skilled therapeutic intervention in order to improve the following deficits and impairments:  Decreased range of motion,Decreased activity tolerance,Pain,Postural dysfunction,Decreased strength,Decreased mobility  Visit Diagnosis: Cervicalgia  Radiculopathy, cervical region  Muscle weakness (generalized)     Problem List Patient Active Problem List   Diagnosis Date Noted  . Neck pain 11/23/2020  . Episode of dizziness 03/01/2020  . 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 Paresthesia of arm 03/01/2020  . Previous cesarean delivery affecting pregnancy, antepartum 06/16/2018    Marvell Fuller, PTA 12/26/2020, 9:55 AM  [facility] [address] [place], [place], [address] Phone: [phone]   Fax:  [phone]  Name: Ruth Arellano MRN: [id number] Date of Birth: 01/01/1981

## 2021-01-02 ENCOUNTER — Other Ambulatory Visit: Payer: Self-pay

## 2021-01-02 ENCOUNTER — Encounter: Payer: Self-pay | Admitting: Physical Therapy

## 2021-01-02 ENCOUNTER — Ambulatory Visit: Payer: Medicaid Other | Attending: Orthopaedic Surgery | Admitting: Physical Therapy

## 2021-01-02 DIAGNOSIS — M6281 Muscle weakness (generalized): Secondary | ICD-10-CM | POA: Diagnosis present

## 2021-01-02 DIAGNOSIS — M5412 Radiculopathy, cervical region: Secondary | ICD-10-CM | POA: Diagnosis present

## 2021-01-02 DIAGNOSIS — M542 Cervicalgia: Secondary | ICD-10-CM | POA: Diagnosis present

## 2021-01-02 NOTE — Therapy (Signed)
[facility] [address] [place], [place], [address] Phone: [phone]   Fax:  [phone]  Physical Therapy Treatment  Patient Details  Name: Ruth Arellano MRN: [id number] Date of Birth: [birth year] Referring Provider (PT): Norlene Campbell, MD   Encounter Date: 01/02/2021   PT End of Session - 01/02/21 0824    Visit Number 3    Number of Visits 12    Date for PT Re-Evaluation 01/19/21    Authorization Type Medicaid Healthy Blue (No modalities) ; Progress note every 10th visit    PT Start Time 0815    PT Stop Time 0857    PT Time Calculation (min) 42 min    Activity Tolerance Patient tolerated treatment well    Behavior During Therapy [facility] for tasks assessed/performed           History reviewed. No pertinent past medical history.  Past Surgical History:  Procedure Laterality Date  . APPENDECTOMY    . CESAREAN SECTION     2   . ECTOPIC PREGNANCY SURGERY      There were no vitals filed for this visit.   Subjective Assessment - 01/02/21 0824    Subjective COVID-19 screening performed upon arrival. Denies any cervical pain or numbness in UE upon arrival.    Limitations House hold activities    Patient Stated Goals stop arm pain    Currently in Pain? No/denies              [facility] PT Assessment - 01/02/21 0001      Assessment   Medical Diagnosis Neck pain, paresthesia of arm    Referring Provider (PT) Norlene Campbell, MD    Hand Dominance Right    Prior Therapy no      Precautions   Precautions None                         [facility] Adult PT Treatment/Exercise - 01/02/21 0001      Neck Exercises: Machines for Strengthening   UBE (Upper Arm Bike) 6RPM x6 min (forward/backward)      Neck Exercises: Standing   Upper Extremity D2 Flexion;20 reps;Theraband    Theraband Level (UE D2) Level 1 (Yellow)    UE D2 Limitations towel at head      Shoulder Exercises: Standing   Horizontal ABduction  Strengthening;Both;20 reps    Theraband Level (Shoulder Horizontal ABduction) Level 1 (Yellow)    Horizontal ABduction Limitations towel at head    External Rotation Strengthening;Both;20 reps;Theraband    Theraband Level (Shoulder External Rotation) Level 1 (Yellow)    External Rotation Limitations towel at head    Flexion AROM;Both;20 reps;Limitations    Flexion Limitations towel at head    Extension Strengthening;Both;20 reps;Limitations    Extension Limitations Green XTS    Row Strengthening;Both;20 reps;Theraband    Row Limitations Green XTS    Other Standing Exercises B shoulder scaption AROM x20 reps with towel at head      Shoulder Exercises: ROM/Strengthening   Wall Pushups 20 reps    X to V Arms x20 reps      Shoulder Exercises: Stretch   Corner Stretch 3 reps;30 seconds      Manual Therapy   Manual Therapy Soft tissue mobilization    Soft tissue mobilization STW to B UT, levator scapula, cervical paraspinals to reduce tone                    PT Short  Term Goals - 01/02/21 0901      PT SHORT TERM GOAL #1   Title Patient will be independent with HEP    Baseline no knowledge of HEP    Time 3    Period Weeks    Status Achieved             PT Long Term Goals - 01/02/21 0901      PT LONG TERM GOAL #1   Title Patient will be independent with advanced HEP    Baseline no knowledge of HEP    Time 6    Period Weeks    Status On-going      PT LONG TERM GOAL #2   Title Patient will demonstrate 4/5 or greater left UE MMT in all planes to improve stability during functional tasks    Baseline 3+/5    Time 6    Period Weeks    Status On-going      PT LONG TERM GOAL #3   Title Patient will report an elimination of left UE neurological symptoms to improve sleeping at night    Baseline left UE symptoms to finger tips    Time 6    Period Weeks    Status On-going                 Plan - 01/02/21 0901    Clinical Impression Statement Patient  presented in clinic with no cervical or UE pain. Patient progressed through postural and cervical stabilization exercises with towel at posterior head for all standing exercises. Patient indicated increased stretch especially in R shoulder. No cervical or UE pain was reported by patient during session. Min muscle tightness palpable in superior fibers of UT and into cervical paraspinals too.    Personal Factors and Comorbidities Time since onset of injury/illness/exacerbation    Examination-Activity Limitations Sleep    Stability/Clinical Decision Making Stable/Uncomplicated    Rehab Potential Fair    PT Frequency 2x / week    PT Duration 6 weeks    PT Treatment/Interventions Therapeutic activities;Therapeutic exercise;Manual techniques;Neuromuscular re-education;Passive range of motion;Patient/family education;ADLs/Self Care Home Management;Ultrasound    PT Next Visit Plan UBE, postural exercises, cervical ROM, ultrasound to L UT; no traction, e-stim, cold pack, iontophoresis, or vaso    PT Home Exercise Plan see patient education section    Consulted and Agree with Plan of Care Patient           Patient will benefit from skilled therapeutic intervention in order to improve the following deficits and impairments:  Decreased range of motion,Decreased activity tolerance,Pain,Postural dysfunction,Decreased strength,Decreased mobility  Visit Diagnosis: Cervicalgia  Radiculopathy, cervical region  Muscle weakness (generalized)     Problem List Patient Active Problem List   Diagnosis Date Noted  . Neck pain 11/23/2020  . Episode of dizziness 03/01/2020  . 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 Paresthesia of arm 03/01/2020  . Previous cesarean delivery affecting pregnancy, antepartum 06/16/2018    Marvell Fuller, PTA 01/02/2021, 9:31 AM  [facility] [address] [place], [place], [address] Phone: [phone]   Fax:  [phone]  Name:  Ruth Arellano MRN: [id number] Date of Birth: [birth year]

## 2021-01-05 ENCOUNTER — Encounter: Payer: Self-pay | Admitting: Family Medicine

## 2021-01-09 ENCOUNTER — Ambulatory Visit: Payer: Medicaid Other | Admitting: Physical Therapy

## 2021-01-09 ENCOUNTER — Encounter: Payer: Self-pay | Admitting: Physical Therapy

## 2021-01-09 ENCOUNTER — Other Ambulatory Visit: Payer: Self-pay

## 2021-01-09 DIAGNOSIS — M542 Cervicalgia: Secondary | ICD-10-CM | POA: Diagnosis not present

## 2021-01-09 DIAGNOSIS — M6281 Muscle weakness (generalized): Secondary | ICD-10-CM

## 2021-01-09 DIAGNOSIS — M5412 Radiculopathy, cervical region: Secondary | ICD-10-CM

## 2021-01-09 NOTE — Therapy (Signed)
[facility] [address] [place], [place], [address] Phone: [phone]   Fax:  [phone]  Physical Therapy Treatment  Patient Details  Name: Ruth Arellano MRN: [id number] Date of Birth: [birth year] Referring Provider (PT): Norlene Campbell, MD   Encounter Date: 01/09/2021   PT End of Session - 01/09/21 0817    Visit Number 4    Number of Visits 12    Date for PT Re-Evaluation 01/19/21    Authorization Type Medicaid Healthy Blue (No modalities) ; Progress note every 10th visit    PT Start Time 0815    PT Stop Time 0859    PT Time Calculation (min) 44 min    Activity Tolerance Patient tolerated treatment well    Behavior During Therapy [facility] for tasks assessed/performed           History reviewed. No pertinent past medical history.  Past Surgical History:  Procedure Laterality Date  . APPENDECTOMY    . CESAREAN SECTION     2   . ECTOPIC PREGNANCY SURGERY      There were no vitals filed for this visit.   Subjective Assessment - 01/09/21 0816    Subjective COVID-19 screening performed upon arrival. Denies any cervical pain but still having numbness from L elbow to fingertips especially at night regardless of position. Having more pain in R wrist but had a previous fall 2 years ago in which she tried to break her fall with her RUE.    Patient is accompained by: Interpreter   [name]   Limitations House hold activities    Patient Stated Goals stop arm pain    Currently in Pain? No/denies              [facility] PT Assessment - 01/09/21 0001      Assessment   Medical Diagnosis Neck pain, paresthesia of arm    Referring Provider (PT) Norlene Campbell, MD    Hand Dominance Right    Prior Therapy no      Precautions   Precautions None      Restrictions   Weight Bearing Restrictions No                         [facility] Adult PT Treatment/Exercise - 01/09/21 0001      Neck Exercises: Machines for Strengthening    UBE (Upper Arm Bike) 90 RPM x6 min (forward/backward)      Neck Exercises: Seated   X to V 20 reps    Upper Extremity D2 Flexion;20 reps;Theraband    Theraband Level (UE D2) Level 2 (Red)      Neck Exercises: Supine   Neck Retraction 10 reps;5 secs      Neck Exercises: Prone   W Back 15 reps;Limitations    W Back Limitations over ball    Shoulder Extension 15 reps;Limitations    Shoulder Extension Limitations over ball    Other Prone Exercise Horizontal abduction, scaption over ball x15 reps      Shoulder Exercises: Sidelying   External Rotation Strengthening;Both;20 reps;Weights    External Rotation Weight (lbs) 2      Shoulder Exercises: Standing   Extension Strengthening;Both;20 reps;Limitations    Extension Limitations Blue XTS    Row Strengthening;Both;20 reps;Theraband    Row Limitations [employer]                    PT Short Term Goals - 01/02/21 [phone]  PT SHORT TERM GOAL #1   Title Patient will be independent with HEP    Baseline no knowledge of HEP    Time 3    Period Weeks    Status Achieved             PT Long Term Goals - 01/02/21 0901      PT LONG TERM GOAL #1   Title Patient will be independent with advanced HEP    Baseline no knowledge of HEP    Time 6    Period Weeks    Status On-going      PT LONG TERM GOAL #2   Title Patient will demonstrate 4/5 or greater left UE MMT in all planes to improve stability during functional tasks    Baseline 3+/5    Time 6    Period Weeks    Status On-going      PT LONG TERM GOAL #3   Title Patient will report an elimination of left UE neurological symptoms to improve sleeping at night    Baseline left UE symptoms to finger tips    Time 6    Period Weeks    Status On-going                 Plan - 01/09/21 0934    Clinical Impression Statement Patient presented in clinic with interpreter today. Patient reporting only tingling and numbness from L elbow to fingertips intermittantly and  especially at night regardless of position. Patient progressed through more postural strengthening in prone with cervical neutral and extension. Patient was limited with any modified QP positions or wall pushups secondary to flare up of R wrist pain from a fall a couple of years ago in which she tried to break a fall with R hand. Patient encouraged to continue HEP as directed. Patient reports lifting her 14 lb two year old child frequently which may exaggerate pain.    Personal Factors and Comorbidities Time since onset of injury/illness/exacerbation    Examination-Activity Limitations Sleep    Stability/Clinical Decision Making Stable/Uncomplicated    Rehab Potential Fair    PT Frequency 2x / week    PT Duration 6 weeks    PT Treatment/Interventions Therapeutic activities;Therapeutic exercise;Manual techniques;Neuromuscular re-education;Passive range of motion;Patient/family education;ADLs/Self Care Home Management;Ultrasound    PT Next Visit Plan UBE, postural exercises, cervical ROM, ultrasound to L UT; no traction, e-stim, cold pack, iontophoresis, or vaso    PT Home Exercise Plan see patient education section    Consulted and Agree with Plan of Care Patient           Patient will benefit from skilled therapeutic intervention in order to improve the following deficits and impairments:  Decreased range of motion,Decreased activity tolerance,Pain,Postural dysfunction,Decreased strength,Decreased mobility  Visit Diagnosis: Cervicalgia  Radiculopathy, cervical region  Muscle weakness (generalized)     Problem List Patient Active Problem List   Diagnosis Date Noted  . Neck pain 11/23/2020  . Episode of dizziness 03/01/2020  . 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 Paresthesia of arm 03/01/2020  . Previous cesarean delivery affecting pregnancy, antepartum 06/16/2018    Marvell Fuller, PTA 01/09/2021, 9:38 AM  [facility] [address] [place], [place], [address] Phone: [phone]   Fax:  [phone]  Name: Merlina Marchena MRN: [id number] Date of Birth: 1981-01-17

## 2021-01-16 ENCOUNTER — Ambulatory Visit: Payer: Medicaid Other | Admitting: Physical Therapy

## 2021-01-16 ENCOUNTER — Other Ambulatory Visit: Payer: Self-pay

## 2021-01-16 ENCOUNTER — Encounter: Payer: Self-pay | Admitting: Physical Therapy

## 2021-01-16 DIAGNOSIS — M542 Cervicalgia: Secondary | ICD-10-CM | POA: Diagnosis not present

## 2021-01-16 DIAGNOSIS — M5412 Radiculopathy, cervical region: Secondary | ICD-10-CM

## 2021-01-16 DIAGNOSIS — M6281 Muscle weakness (generalized): Secondary | ICD-10-CM

## 2021-01-16 NOTE — Therapy (Signed)
[facility] [place], [place], [address] Phone: [phone]   Fax:  [phone]  Physical Therapy Treatment  Patient Details  Name: Ruth Arellano MRN: [id number] Date of Birth: Jan 04, 1981 Referring Provider (PT): Joni Fears, MD   Encounter Date: 01/16/2021   PT End of Session - 01/16/21 0808    Visit Number 5    Number of Visits 12    Date for PT Re-Evaluation 01/19/21    Authorization Type Medicaid Healthy Blue (No modalities) ; Progress note every 10th visit    PT Start Time 0815    PT Stop Time 0858    PT Time Calculation (min) 43 min    Activity Tolerance Patient tolerated treatment well    Behavior During Therapy [facility] for tasks assessed/performed           History reviewed. No pertinent past medical history.  Past Surgical History:  Procedure Laterality Date  . APPENDECTOMY    . CESAREAN SECTION     2   . ECTOPIC PREGNANCY SURGERY      There were no vitals filed for this visit.   Subjective Assessment - 01/16/21 0808    Subjective COVID-19 screening performed upon arrival. Had some pain in L hand at 3-4 am.    Patient is accompained by: Interpreter   [name]   Limitations House hold activities    Patient Stated Goals stop arm pain    Currently in Pain? No/denies              [facility] PT Assessment - 01/16/21 0001      Assessment   Medical Diagnosis Neck pain, paresthesia of arm    Referring Provider (PT) Joni Fears, MD    Hand Dominance Right    Next MD Visit 01/18/2021    Prior Therapy no      Precautions   Precautions None      Restrictions   Weight Bearing Restrictions No      ROM / Strength   AROM / PROM / Strength Strength      Strength   Overall Strength Within functional limits for tasks performed    Strength Assessment Site Shoulder    Right/Left Shoulder Left    Right Shoulder Flexion 4+/5    Right Shoulder ABduction 4+/5    Right Shoulder Internal Rotation 4/5     Right Shoulder External Rotation 4/5                         [facility] Adult PT Treatment/Exercise - 01/16/21 0001      Neck Exercises: Machines for Strengthening   UBE (Upper Arm Bike) 60 RPM x8 min (forward/backward)      Neck Exercises: Standing   Other Standing Exercises LUE medial and radial nerve glides with more stretching noted with radial glides      Neck Exercises: Seated   X to V 20 reps    Shoulder Flexion Both;20 reps;Weights    Shoulder Flexion Weights (lbs) 2    Shoulder ABduction Both;20 reps;Weights    Shoulder Abduction Weights (lbs) 2      Neck Exercises: Prone   W Back 20 reps;Limitations    W Back Limitations over ball    Shoulder Extension 20 reps;Limitations    Shoulder Extension Limitations over ball    Other Prone Exercise Horizontal abduction, scaption over ball x20 reps      Shoulder Exercises: Seated   Horizontal ABduction Strengthening;Both;20 reps;Theraband  Theraband Level (Shoulder Horizontal ABduction) Level 2 (Red)    External Rotation Strengthening;Both;20 reps;Theraband    Theraband Level (Shoulder External Rotation) Level 2 (Red)    Diagonals Strengthening;Both;20 reps;Theraband    Theraband Level (Shoulder Diagonals) Level 2 (Red)      Shoulder Exercises: Standing   Extension Strengthening;Both;20 reps;Limitations    Extension Limitations Blue XTS    Row Strengthening;Both;20 reps;Theraband    Row Limitations Florencia Reasons                  PT Education - 01/16/21 [phone]    Education Details UT, levator scapula, rhomboids stretches    Person(s) Educated Patient   via interpreter   Methods Explanation;Demonstration;Handout    Comprehension Verbalized understanding;Returned demonstration            PT Short Term Goals - 01/02/21 0901      PT SHORT TERM GOAL #1   Title Patient will be independent with HEP    Baseline no knowledge of HEP    Time 3    Period Weeks    Status Achieved             PT Long Term  Goals - 01/16/21 2440      PT LONG TERM GOAL #1   Title Patient will be independent with advanced HEP    Baseline no knowledge of HEP    Time 6    Period Weeks    Status On-going      PT LONG TERM GOAL #2   Title Patient will demonstrate 4/5 or greater left UE MMT in all planes to improve stability during functional tasks    Baseline 3+/5    Time 6    Period Weeks    Status Achieved      PT LONG TERM GOAL #3   Title Patient will report an elimination of left UE neurological symptoms to improve sleeping at night    Baseline left UE symptoms to finger tips    Time 6    Period Weeks    Status Partially Met   Less frequency, less intensity per patient 01/16/2021                Plan - 01/16/21 0905    Clinical Impression Statement Patient presented in clinic with reports of less LUE symptoms with less intensity. Patient continues to experience more radicular symptoms at night. Patient progressed through postural strengthening as well as cervical strengthening. LUE MMT has improved with strengthening progression. Positioning modified for comfort with prone exercises. Patient provided new stretching cervical HEP with reports of understanding after education.    Personal Factors and Comorbidities Time since onset of injury/illness/exacerbation    Examination-Activity Limitations Sleep    Stability/Clinical Decision Making Stable/Uncomplicated    Rehab Potential Fair    PT Frequency 2x / week    PT Duration 6 weeks    PT Treatment/Interventions Therapeutic activities;Therapeutic exercise;Manual techniques;Neuromuscular re-education;Passive range of motion;Patient/family education;ADLs/Self Care Home Management;Ultrasound    PT Next Visit Plan Continue per MD discretion. No traction, electrical stimulation, cold pack, iontophoresis or vaso per [facility] healthy blue plan.    PT Home Exercise Plan see patient education section    Consulted and Agree with Plan of Care Patient            Patient will benefit from skilled therapeutic intervention in order to improve the following deficits and impairments:  Decreased range of motion,Decreased activity tolerance,Pain,Postural dysfunction,Decreased strength,Decreased mobility  Visit Diagnosis: Cervicalgia  Radiculopathy, cervical region  Muscle weakness (generalized)     Problem List Patient Active Problem List   Diagnosis Date Noted  . Neck pain 11/23/2020  . Episode of dizziness 03/01/2020  . 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 Paresthesia of arm 03/01/2020  . Previous cesarean delivery affecting pregnancy, antepartum 06/16/2018    Standley Brooking, PTA 01/16/21 9:10 AM   [facility] [place], [place], [address] Phone: [phone]   Fax:  [phone]  Name: Ruth Arellano MRN: [id number] Date of Birth: [birth year]

## 2021-01-18 ENCOUNTER — Encounter: Payer: Self-pay | Admitting: Orthopaedic Surgery

## 2021-01-18 ENCOUNTER — Other Ambulatory Visit: Payer: Self-pay

## 2021-01-18 ENCOUNTER — Ambulatory Visit (INDEPENDENT_AMBULATORY_CARE_PROVIDER_SITE_OTHER): Payer: Medicaid Other | Admitting: Orthopaedic Surgery

## 2021-01-18 VITALS — Ht 64.0 in | Wt 122.0 lb

## 2021-01-18 DIAGNOSIS — M542 Cervicalgia: Secondary | ICD-10-CM | POA: Diagnosis not present

## 2021-01-18 DIAGNOSIS — R202 Paresthesia of skin: Secondary | ICD-10-CM

## 2021-01-18 NOTE — Progress Notes (Signed)
Office Visit Note   Patient: Ruth Arellano           Date of Birth: 11/16/1980           MRN: [id number] Visit Date: 01/18/2021              Requested by: Gwenlyn Fudge, FNP [address] [place],  [place] [address] PCP: Gwenlyn Fudge, FNP   Assessment & Plan: Visit Diagnoses:  1. Paresthesia of arm   2. Neck pain     Plan: Feeling much better with less numbness and tingling into both upper extremities.  Minimal discomfort with her cervical spine.  Has been attending physical therapy sessions and would like to continue.  Exam performed with interpreter on the phone.  No loss of cervical spine motion and good grip and release of both hands.  May continue with the splints if necessary at night.  Has been limiting amount of repetitive and heavy activity resulting in decreased symptoms  Follow-Up Instructions: Return in about 1 month (around 02/18/2021).   Orders:  No orders of the defined types were placed in this encounter.  No orders of the defined types were placed in this encounter.     Procedures: No procedures performed   Clinical Data: No additional findings.   Subjective: Chief Complaint  Patient presents with  . Neck - Follow-up  . Right Hand - Follow-up  Patient presents today for a one month follow up on her neck and right hand pain. She said that she is doing much better since her last visit. She is going to physical therapy. She continues to wear her volar splint as needed.   HPI  Review of Systems   Objective: Vital Signs: Ht 5\' 4"  (1.626 m)   Wt 122 lb (55.3 kg)   BMI 20.94 kg/m   Physical Exam Constitutional:      Appearance: She is well-developed.  Eyes:     Pupils: Pupils are equal, round, and reactive to light.  Pulmonary:     Effort: Pulmonary effort is normal.  Skin:    General: Skin is warm and dry.  Neurological:     Mental Status: She is alert and oriented to person, place, and time.  Psychiatric:        Behavior: Behavior  normal.     Ortho Exam exam performed with interpreter on the phone.  Painless range of motion of cervical spine with no referred pain to either upper extremity.  Good grip and good release.  Minimal discomfort on pressure over the median nerves of both hands.  No hand edema.  Good opposition of thumb the little finger and normal sensibility Specialty Comments:  No specialty comments available.  Imaging: No results found.   PMFS History: Patient Active Problem List   Diagnosis Date Noted  . Neck pain 11/23/2020  . Episode of dizziness 03/01/2020  . 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 Paresthesia of arm 03/01/2020  . Previous cesarean delivery affecting pregnancy, antepartum 06/16/2018   History reviewed. No pertinent past medical history.  History reviewed. No pertinent family history.  Past Surgical History:  Procedure Laterality Date  . APPENDECTOMY    . CESAREAN SECTION     2   . ECTOPIC PREGNANCY SURGERY     Social History   Occupational History  . Not on file  Tobacco Use  . Smoking status: Never Smoker  . Smokeless tobacco: Never Used  Vaping Use  . Vaping Use: Never used  Substance and Sexual Activity  . Alcohol use: Never  . Drug use: Never  . Sexual activity: Not on file

## 2021-01-23 ENCOUNTER — Ambulatory Visit: Payer: Medicaid Other | Admitting: Physical Therapy

## 2021-01-25 ENCOUNTER — Other Ambulatory Visit: Payer: Self-pay

## 2021-01-25 ENCOUNTER — Ambulatory Visit: Payer: Medicaid Other | Admitting: Physical Therapy

## 2021-01-25 ENCOUNTER — Encounter: Payer: Self-pay | Admitting: Physical Therapy

## 2021-01-25 DIAGNOSIS — M542 Cervicalgia: Secondary | ICD-10-CM | POA: Diagnosis not present

## 2021-01-25 DIAGNOSIS — M5412 Radiculopathy, cervical region: Secondary | ICD-10-CM

## 2021-01-25 DIAGNOSIS — M6281 Muscle weakness (generalized): Secondary | ICD-10-CM

## 2021-01-25 NOTE — Therapy (Signed)
[facility] [place], [place], [address] Phone: [phone]   Fax:  [phone]  Physical Therapy Treatment  Patient Details  Name: Ruth Arellano MRN: [id number] Date of Birth: Dec 16, 1980 Referring Provider (PT): Joni Fears, MD   Encounter Date: 01/25/2021   PT End of Session - 01/25/21 0826    Visit Number 6    Number of Visits 12    Date for PT Re-Evaluation 01/19/21    Authorization Type Medicaid Healthy Blue (No modalities) ; Progress note every 10th visit    PT Start Time 0816    PT Stop Time 0857    PT Time Calculation (min) 41 min    Activity Tolerance Patient tolerated treatment well    Behavior During Therapy [facility] for tasks assessed/performed           History reviewed. No pertinent past medical history.  Past Surgical History:  Procedure Laterality Date  . APPENDECTOMY    . CESAREAN SECTION     2   . ECTOPIC PREGNANCY SURGERY      There were no vitals filed for this visit.   Subjective Assessment - 01/25/21 0825    Subjective COVID-19 screening performed upon arrival. Reports only numbness of L wrist. MD said to continue PT and to return to him in one month.    Patient is accompained by: Interpreter    Limitations House hold activities    Patient Stated Goals stop arm pain    Currently in Pain? No/denies              [facility] PT Assessment - 01/25/21 0001      Assessment   Medical Diagnosis Neck pain, paresthesia of arm    Referring Provider (PT) Joni Fears, MD    Hand Dominance Right    Next MD Visit 02/2021    Prior Therapy no      Precautions   Precautions None      Restrictions   Weight Bearing Restrictions No                         [facility] Adult PT Treatment/Exercise - 01/25/21 0001      Neck Exercises: Machines for Strengthening   UBE (Upper Arm Bike) 60 RPM x8 min (forward/backward)      Neck Exercises: Standing   Other Standing Exercises LUE medial and  radial nerve glides      Neck Exercises: Prone   W Back 20 reps    Other Prone Exercise snow angel x20 reps    Other Prone Exercise thoracic and cervical extension x20 reps      Shoulder Exercises: Standing   Horizontal ABduction Strengthening;Both;20 reps;Theraband    Theraband Level (Shoulder Horizontal ABduction) Level 2 (Red)    External Rotation Strengthening;Both;20 reps;Theraband    Theraband Level (Shoulder External Rotation) Level 2 (Red)    Flexion Strengthening;Both;20 reps;Weights    Shoulder Flexion Weight (lbs) 2    ABduction Strengthening;Both;20 reps;Weights    Shoulder ABduction Weight (lbs) 2    Extension Strengthening;Both;20 reps;Limitations    Extension Limitations Blue XTS    Row Strengthening;Both;20 reps;Theraband    Row Limitations Blue XTS    Diagonals Strengthening;Both;20 reps;Theraband    Theraband Level (Shoulder Diagonals) Level 2 (Red)    Other Standing Exercises B shoulder scaption 2#x20 reps                    PT Short Term Goals - 01/02/21  0901      PT SHORT TERM GOAL #1   Title Patient will be independent with HEP    Baseline no knowledge of HEP    Time 3    Period Weeks    Status Achieved             PT Long Term Goals - 01/25/21 4481      PT LONG TERM GOAL #1   Title Patient will be independent with advanced HEP    Baseline no knowledge of HEP    Time 6    Period Weeks    Status Achieved      PT LONG TERM GOAL #2   Title Patient will demonstrate 4/5 or greater left UE MMT in all planes to improve stability during functional tasks    Baseline 3+/5    Time 6    Period Weeks    Status Achieved      PT LONG TERM GOAL #3   Title Patient will report an elimination of left UE neurological symptoms to improve sleeping at night    Baseline left UE symptoms to finger tips    Time 6    Period Weeks    Status Partially Met   Less frequency, less intensity per patient 01/16/2021                Plan - 01/25/21  0904    Clinical Impression Statement Patient presented in clinic with reports of only numbness of L wrist. Patient progressed with more postural and shoulder strengthening exercises with no complaints of any increased pain or radicular symptoms. Patient reports that PT has helped her symptoms as they are less frequent. Numbness in L wrist is most notable in the mornings with no known causes for flares. Patient noted that HEP has also helped relieve cervical muscle tightness. Patient has recieved chiropractic care for neck pain while living in [place] but could not tolerate mobilizations and has tried acupuncture before as well with positive response.    Personal Factors and Comorbidities Time since onset of injury/illness/exacerbation    Examination-Activity Limitations Sleep    Stability/Clinical Decision Making Stable/Uncomplicated    Rehab Potential Fair    PT Frequency 2x / week    PT Duration 6 weeks    PT Treatment/Interventions Therapeutic activities;Therapeutic exercise;Manual techniques;Neuromuscular re-education;Passive range of motion;Patient/family education;ADLs/Self Care Home Management;Ultrasound    PT Next Visit Plan Continue per MD discretion. No traction, electrical stimulation, cold pack, iontophoresis or vaso per MCD healthy blue plan.    PT Home Exercise Plan see patient education section    Consulted and Agree with Plan of Care Patient           Patient will benefit from skilled therapeutic intervention in order to improve the following deficits and impairments:  Decreased range of motion,Decreased activity tolerance,Pain,Postural dysfunction,Decreased strength,Decreased mobility  Visit Diagnosis: Cervicalgia  Radiculopathy, cervical region  Muscle weakness (generalized)     Problem List Patient Active Problem List   Diagnosis Date Noted  . Neck pain 11/23/2020  . Episode of dizziness 03/01/2020  . 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 Paresthesia of arm  03/01/2020  . Previous cesarean delivery affecting pregnancy, antepartum 06/16/2018    Standley Brooking, PTA 01/25/2021, 9:18 AM  [facility] [place], [place], [address] Phone: [phone]   Fax:  [phone]  Name: Ruth Arellano MRN: [id number] Date of Birth: May 30, 1981

## 2021-02-02 ENCOUNTER — Encounter: Payer: Self-pay | Admitting: Physical Therapy

## 2021-02-02 ENCOUNTER — Ambulatory Visit: Payer: Medicaid Other | Attending: Orthopaedic Surgery | Admitting: Physical Therapy

## 2021-02-02 ENCOUNTER — Other Ambulatory Visit: Payer: Self-pay

## 2021-02-02 DIAGNOSIS — M542 Cervicalgia: Secondary | ICD-10-CM | POA: Diagnosis present

## 2021-02-02 DIAGNOSIS — M5412 Radiculopathy, cervical region: Secondary | ICD-10-CM | POA: Diagnosis present

## 2021-02-02 DIAGNOSIS — M6281 Muscle weakness (generalized): Secondary | ICD-10-CM | POA: Diagnosis present

## 2021-02-02 NOTE — Therapy (Signed)
[facility] [place], [place], [address] Phone: [phone]   Fax:  [phone]  Physical Therapy Treatment  Patient Details  Name: Ruth Arellano MRN: [id number] Date of Birth: 1981-04-02 Referring Provider (PT): Joni Fears, MD   Encounter Date: 02/02/2021   PT End of Session - 02/02/21 0835    Visit Number 7    Number of Visits 12    Date for PT Re-Evaluation 01/19/21    Authorization Type Medicaid Healthy Blue (No modalities) ; Progress note every 10th visit    PT Start Time 0820    PT Stop Time 0859    PT Time Calculation (min) 39 min    Activity Tolerance Patient tolerated treatment well    Behavior During Therapy [facility] for tasks assessed/performed           History reviewed. No pertinent past medical history.  Past Surgical History:  Procedure Laterality Date  . APPENDECTOMY    . CESAREAN SECTION     2   . ECTOPIC PREGNANCY SURGERY      There were no vitals filed for this visit.   Subjective Assessment - 02/02/21 0835    Subjective COVID-19 screening performed upon arrival. Reports only numbness of L wrist. Denies neck or wrist pain today.    Limitations House hold activities    Patient Stated Goals stop arm pain    Currently in Pain? No/denies              [facility] PT Assessment - 02/02/21 0001      Assessment   Medical Diagnosis Neck pain, paresthesia of arm    Referring Provider (PT) Joni Fears, MD    Next MD Visit 02/2021    Prior Therapy no      Precautions   Precautions None      Restrictions   Weight Bearing Restrictions No                         [facility] Adult PT Treatment/Exercise - 02/02/21 0001      Neck Exercises: Machines for Strengthening   UBE (Upper Arm Bike) 60 RPM x8 min (forward/backward)      Neck Exercises: Theraband   Shoulder External Rotation 20 reps;Red    Horizontal ABduction 20 reps;Red    Other Theraband Exercises B shoulder D2 red theraband  x20 reps      Neck Exercises: Standing   Other Standing Exercises wall walks red theraband x2 RT; B shoulder flexion with ER red theraband x20 reps    Other Standing Exercises B open book at wall x20 reps      Neck Exercises: Seated   Shoulder Shrugs 20 reps;Limitations    Shoulder Shrugs Limitations 2#    Shoulder Rolls Backwards;20 reps;Limitations    Shoulder Rolls Limitations 2#      Neck Exercises: Prone   W Back 20 reps    Other Prone Exercise snow angel x20 reps    Other Prone Exercise thoracic and cervical extension x20 reps      Shoulder Exercises: Prone   Flexion AROM;Both;20 reps    Horizontal ABduction 1 AROM;Both;20 reps      Shoulder Exercises: Standing   Flexion Strengthening;Both;20 reps;Weights    Shoulder Flexion Weight (lbs) 2    ABduction Strengthening;Both;20 reps;Weights    Shoulder ABduction Weight (lbs) 2    Extension Strengthening;Both;20 reps;Limitations    Extension Limitations Blue XTS    Row Strengthening;Both;20 reps;Theraband  Row Limitations Blue XTS    Diagonals Strengthening;Both;20 reps;Theraband    Theraband Level (Shoulder Diagonals) Level 2 (Red)    Other Standing Exercises B shoulder scaption 2#x20 reps                    PT Short Term Goals - 01/02/21 0901      PT SHORT TERM GOAL #1   Title Patient will be independent with HEP    Baseline no knowledge of HEP    Time 3    Period Weeks    Status Achieved             PT Long Term Goals - 01/25/21 1779      PT LONG TERM GOAL #1   Title Patient will be independent with advanced HEP    Baseline no knowledge of HEP    Time 6    Period Weeks    Status Achieved      PT LONG TERM GOAL #2   Title Patient will demonstrate 4/5 or greater left UE MMT in all planes to improve stability during functional tasks    Baseline 3+/5    Time 6    Period Weeks    Status Achieved      PT LONG TERM GOAL #3   Title Patient will report an elimination of left UE neurological  symptoms to improve sleeping at night    Baseline left UE symptoms to finger tips    Time 6    Period Weeks    Status Partially Met   Less frequency, less intensity per patient 01/16/2021                Plan - 02/02/21 0957    Clinical Impression Statement Patient presented in clinic with no cervical or wrist pain today. Patient denies any pain waking in while sleeping at this time. Patient able to tolerate all postural strengthening well other than fatigue.    Personal Factors and Comorbidities Time since onset of injury/illness/exacerbation    Examination-Activity Limitations Sleep    Stability/Clinical Decision Making Stable/Uncomplicated    Rehab Potential Fair    PT Frequency 2x / week    PT Duration 6 weeks    PT Treatment/Interventions Therapeutic activities;Therapeutic exercise;Manual techniques;Neuromuscular re-education;Passive range of motion;Patient/family education;ADLs/Self Care Home Management;Ultrasound    PT Next Visit Plan Continue per MD discretion. No traction, electrical stimulation, cold pack, iontophoresis or vaso per MCD healthy blue plan.    PT Home Exercise Plan see patient education section    Consulted and Agree with Plan of Care Patient           Patient will benefit from skilled therapeutic intervention in order to improve the following deficits and impairments:  Decreased range of motion,Decreased activity tolerance,Pain,Postural dysfunction,Decreased strength,Decreased mobility  Visit Diagnosis: Cervicalgia  Radiculopathy, cervical region  Muscle weakness (generalized)     Problem List Patient Active Problem List   Diagnosis Date Noted  . Neck pain 11/23/2020  . Episode of dizziness 03/01/2020  . 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 Paresthesia of arm 03/01/2020  . Previous cesarean delivery affecting pregnancy, antepartum 06/16/2018    Standley Brooking, PTA 02/02/2021, 11:56 AM  [facility] [place], [place], [address] Phone: [phone]   Fax:  [phone]  Name: Doreene Forrey MRN: [id number] Date of Birth: [birth year]

## 2021-02-08 ENCOUNTER — Other Ambulatory Visit: Payer: Self-pay

## 2021-02-08 ENCOUNTER — Encounter: Payer: Self-pay | Admitting: Physical Therapy

## 2021-02-08 ENCOUNTER — Ambulatory Visit: Payer: Medicaid Other | Admitting: Physical Therapy

## 2021-02-08 DIAGNOSIS — M542 Cervicalgia: Secondary | ICD-10-CM | POA: Diagnosis not present

## 2021-02-08 DIAGNOSIS — M5412 Radiculopathy, cervical region: Secondary | ICD-10-CM

## 2021-02-08 DIAGNOSIS — M6281 Muscle weakness (generalized): Secondary | ICD-10-CM

## 2021-02-08 NOTE — Therapy (Signed)
[facility] [place], [place], [address] Phone: [phone]   Fax:  [phone]  Physical Therapy Treatment  Patient Details  Name: Ruth Arellano MRN: [id number] Date of Birth: 09/02/1981 Referring Provider (PT): Joni Fears, MD   Encounter Date: 02/08/2021   PT End of Session - 02/08/21 0812    Visit Number 8    Number of Visits 12    Date for PT Re-Evaluation 01/19/21    Authorization Type Medicaid Healthy Blue (No modalities) ; Progress note every 10th visit    PT Start Time 0820    PT Stop Time 0859    PT Time Calculation (min) 39 min    Activity Tolerance Patient tolerated treatment well    Behavior During Therapy [facility] for tasks assessed/performed           History reviewed. No pertinent past medical history.  Past Surgical History:  Procedure Laterality Date  . APPENDECTOMY    . CESAREAN SECTION     2   . ECTOPIC PREGNANCY SURGERY      There were no vitals filed for this visit.   Subjective Assessment - 02/08/21 0812    Subjective COVID-19 screening performed upon arrival. Reports only numbness of L wrist. Denies neck or wrist pain today.    Patient is accompained by: Family member   Daughter   Limitations House hold activities    Patient Stated Goals stop arm pain    Currently in Pain? No/denies              [facility] PT Assessment - 02/08/21 0001      Assessment   Medical Diagnosis Neck pain, paresthesia of arm    Referring Provider (PT) Joni Fears, MD    Next MD Visit 02/2021    Prior Therapy no      Precautions   Precautions None                         [facility] Adult PT Treatment/Exercise - 02/08/21 0001      Neck Exercises: Machines for Strengthening   UBE (Upper Arm Bike) 60 RPM x8 min (forward/backward)      Neck Exercises: Theraband   Shoulder External Rotation 20 reps;Green    Horizontal ABduction 20 reps;Green      Neck Exercises: Standing   Wall Push Ups 20  reps   neutral wrists   Upper Extremity D2 Flexion;20 reps;Theraband    Theraband Level (UE D2) Level 3 (Green)    Lift / Chop 20 reps;Limitations    Left / Chop Limitations Blue XTS    Other Standing Exercises wall walks red theraband x2 RT; B shoulder flexion with ER red theraband x20 reps    Other Standing Exercises B open book at wall x20 reps      Neck Exercises: Prone   W Back 20 reps    Other Prone Exercise snow angel x20 reps    Other Prone Exercise thoracic and cervical extension x20 reps      Shoulder Exercises: Prone   Flexion AROM;Both;20 reps    Horizontal ABduction 1 AROM;Both;20 reps      Shoulder Exercises: Standing   Protraction Strengthening;Both;20 reps;Limitations    Protraction Limitations Blue XTS    Flexion Strengthening;Both;20 reps;Weights    Shoulder Flexion Weight (lbs) 3    Flexion Limitations shoulder flexion with ER green theraband x15 reps    ABduction Strengthening;Both;20 reps;Weights    Shoulder ABduction Weight (lbs)  3    Extension Strengthening;Both;20 reps;Limitations    Extension Limitations Blue XTS    Row Strengthening;Both;20 reps;Theraband    Row Limitations Blue XTS    Other Standing Exercises B shoulder scaption 2#x20 reps                    PT Short Term Goals - 01/02/21 0901      PT SHORT TERM GOAL #1   Title Patient will be independent with HEP    Baseline no knowledge of HEP    Time 3    Period Weeks    Status Achieved             PT Long Term Goals - 01/25/21 2563      PT LONG TERM GOAL #1   Title Patient will be independent with advanced HEP    Baseline no knowledge of HEP    Time 6    Period Weeks    Status Achieved      PT LONG TERM GOAL #2   Title Patient will demonstrate 4/5 or greater left UE MMT in all planes to improve stability during functional tasks    Baseline 3+/5    Time 6    Period Weeks    Status Achieved      PT LONG TERM GOAL #3   Title Patient will report an elimination of  left UE neurological symptoms to improve sleeping at night    Baseline left UE symptoms to finger tips    Time 6    Period Weeks    Status Partially Met   Less frequency, less intensity per patient 01/16/2021                Plan - 02/08/21 0919    Clinical Impression Statement Patient presented in clinic with no complaints of pain in cervical spine and L wrist. Patient guided through more resistance and focus of posture awareness. Patient denies waking due to radicular pain and denies any headaches at this time. Patient does spontaneously and very occasional have L wrist numbness. Patient only reporting fatigue following end of treatment.    Personal Factors and Comorbidities Time since onset of injury/illness/exacerbation    Examination-Activity Limitations Sleep    Stability/Clinical Decision Making Stable/Uncomplicated    Rehab Potential Fair    PT Frequency 2x / week    PT Duration 6 weeks    PT Treatment/Interventions Therapeutic activities;Therapeutic exercise;Manual techniques;Neuromuscular re-education;Passive range of motion;Patient/family education;ADLs/Self Care Home Management;Ultrasound    PT Next Visit Plan D/C or on hold?    PT Home Exercise Plan see patient education section    Consulted and Agree with Plan of Care Patient           Patient will benefit from skilled therapeutic intervention in order to improve the following deficits and impairments:  Decreased range of motion,Decreased activity tolerance,Pain,Postural dysfunction,Decreased strength,Decreased mobility  Visit Diagnosis: Cervicalgia  Radiculopathy, cervical region  Muscle weakness (generalized)     Problem List Patient Active Problem List   Diagnosis Date Noted  . Neck pain 11/23/2020  . Episode of dizziness 03/01/2020  . 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 Paresthesia of arm 03/01/2020  . Previous cesarean delivery affecting pregnancy, antepartum 06/16/2018    Standley Brooking,  PTA 02/08/2021, 9:21 AM  [facility] [place], [place], [address] Phone: [phone]   Fax:  [phone]  Name: Makita Blow MRN: [id number] Date of Birth: Sep 07, 1980

## 2021-02-13 ENCOUNTER — Ambulatory Visit: Payer: Medicaid Other | Admitting: Physical Therapy

## 2021-02-13 ENCOUNTER — Encounter: Payer: Self-pay | Admitting: Physical Therapy

## 2021-02-13 ENCOUNTER — Other Ambulatory Visit: Payer: Self-pay

## 2021-02-13 DIAGNOSIS — M5412 Radiculopathy, cervical region: Secondary | ICD-10-CM

## 2021-02-13 DIAGNOSIS — M6281 Muscle weakness (generalized): Secondary | ICD-10-CM

## 2021-02-13 DIAGNOSIS — M542 Cervicalgia: Secondary | ICD-10-CM

## 2021-02-13 NOTE — Therapy (Signed)
[facility] [address] [place], [place], [address] Phone: [phone]   Fax:  [phone]  Physical Therapy Treatment  Patient Details  Name: Ruth Arellano MRN: [id number] Date of Birth: [birth year] Referring Provider (PT): Norlene Campbell, MD   Encounter Date: 02/13/2021   PT End of Session - 02/13/21 0818     Visit Number 9    Number of Visits 12    Date for PT Re-Evaluation 01/19/21    Authorization Type Medicaid Healthy [employer] (No modalities) ; Progress note every 10th visit    PT Start Time 0815    PT Stop Time 0857    PT Time Calculation (min) 42 min    Activity Tolerance Patient tolerated treatment well    Behavior During Therapy [facility] for tasks assessed/performed             History reviewed. No pertinent past medical history.  Past Surgical History:  Procedure Laterality Date   APPENDECTOMY     CESAREAN SECTION     2    ECTOPIC PREGNANCY SURGERY      There were no vitals filed for this visit.   Subjective Assessment - 02/13/21 0818     Subjective COVID-19 screening performed upon arrival. Reports only numbness of L wrist. Denies neck or wrist pain today.    Patient is accompained by: Family member   Daughter   Limitations House hold activities    Patient Stated Goals stop arm pain    Currently in Pain? No/denies                [facility] PT Assessment - 02/13/21 0001       Assessment   Medical Diagnosis Neck pain, paresthesia of arm    Referring Provider (PT) Norlene Campbell, MD    Next MD Visit 02/15/2021    Prior Therapy no      Precautions   Precautions None      Restrictions   Weight Bearing Restrictions No                           [facility] Adult PT Treatment/Exercise - 02/13/21 0001       Neck Exercises: Machines for Strengthening   UBE (Upper Arm Bike) 120 RPM x8 min (forward/backward)      Neck Exercises: Theraband   Scapula Retraction 20 reps;Green    Shoulder Extension  20 reps;Green    Shoulder External Rotation 20 reps;Green    Horizontal ABduction 20 reps;Green      Neck Exercises: Standing   Wall Push Ups 20 reps    Wall Push Ups Limitations neutral wrist    Upper Extremity D2 Flexion;20 reps;Theraband    Theraband Level (UE D2) Level 3 (Green)    Lift / Chop 20 reps;Limitations    Left / Chop Limitations Blue XTS    Other Standing Exercises wall walks green theraband x2 RT; B shoulder flexion with ER green theraband x20 reps    Other Standing Exercises B open book at wall x20 reps      Neck Exercises: Prone   W Back 20 reps;Weight    W Back Weights (lbs) 1    W Back Limitations with thoracic extension    Other Prone Exercise snow angel x20 reps    Other Prone Exercise Mad cat/ horse with cervical extension x20 reps      Shoulder Exercises: Prone   Flexion Strengthening;Both;20 reps;Weights    Flexion Weight (lbs) 1  Horizontal ABduction 1 Strengthening;Both;20 reps;Weights    Horizontal ABduction 1 Weight (lbs) 1      Shoulder Exercises: Standing   Protraction Strengthening;Both;20 reps;Limitations    Protraction Limitations Blue XTS    Flexion Strengthening;Both;20 reps;Weights    Shoulder Flexion Weight (lbs) 3    ABduction Strengthening;Both;20 reps;Weights    Shoulder ABduction Weight (lbs) 3    Other Standing Exercises B shoulder scaption 3#x20 reps                      PT Short Term Goals - 01/02/21 0901       PT SHORT TERM GOAL #1   Title Patient will be independent with HEP    Baseline no knowledge of HEP    Time 3    Period Weeks    Status Achieved               PT Long Term Goals - 02/13/21 9767       PT LONG TERM GOAL #1   Title Patient will be independent with advanced HEP    Baseline no knowledge of HEP    Time 6    Period Weeks    Status Achieved      PT LONG TERM GOAL #2   Title Patient will demonstrate 4/5 or greater left UE MMT in all planes to improve stability during functional  tasks    Baseline 3+/5    Time 6    Period Weeks    Status Achieved      PT LONG TERM GOAL #3   Title Patient will report an elimination of left UE neurological symptoms to improve sleeping at night    Baseline left UE symptoms to finger tips    Time 6    Period Weeks    Status Achieved                   Plan - 02/13/21 0859     Clinical Impression Statement Patient presented in clinic with elimination of all cervical and radicular symptoms at this time. Greater emphasis has been placed and cervical and thoracic mobility with postural strengthening as well. Patient educated that all goals have been achieved at this time and continuation of PT contingent on MD visit 02/15/2021.    Personal Factors and Comorbidities Time since onset of injury/illness/exacerbation    Examination-Activity Limitations Sleep    Stability/Clinical Decision Making Stable/Uncomplicated    Rehab Potential Fair    PT Frequency 2x / week    PT Duration 6 weeks    PT Treatment/Interventions Therapeutic activities;Therapeutic exercise;Manual techniques;Neuromuscular re-education;Passive range of motion;Patient/family education;ADLs/Self Care Home Management;Ultrasound    PT Next Visit Plan Return to MD for follow up. Possible DC    PT Home Exercise Plan see patient education section    Consulted and Agree with Plan of Care Patient             Patient will benefit from skilled therapeutic intervention in order to improve the following deficits and impairments:  Decreased range of motion, Decreased activity tolerance, Pain, Postural dysfunction, Decreased strength, Decreased mobility  Visit Diagnosis: Cervicalgia  Radiculopathy, cervical region  Muscle weakness (generalized)     Problem List Patient Active Problem List   Diagnosis Date Noted   Neck pain 11/23/2020   Episode of dizziness 03/01/2020   Tinnitus of left ear 03/01/2020   Paresthesia of arm 03/01/2020   Previous cesarean  delivery affecting pregnancy, antepartum 06/16/2018   Adelina Mings  Fatima Blank, PTA 02/13/21 9:03 AM   [facility] [address] [place], [place], [address] Phone: [phone]   Fax:  [phone]  Name: Ruth Arellano MRN: [id number] Date of Birth: 07-01-1981

## 2021-02-15 ENCOUNTER — Encounter: Payer: Self-pay | Admitting: Orthopaedic Surgery

## 2021-02-15 ENCOUNTER — Ambulatory Visit (INDEPENDENT_AMBULATORY_CARE_PROVIDER_SITE_OTHER): Payer: Medicaid Other | Admitting: Orthopaedic Surgery

## 2021-02-15 ENCOUNTER — Other Ambulatory Visit: Payer: Self-pay

## 2021-02-15 DIAGNOSIS — R202 Paresthesia of skin: Secondary | ICD-10-CM | POA: Diagnosis not present

## 2021-02-15 DIAGNOSIS — M542 Cervicalgia: Secondary | ICD-10-CM

## 2021-02-15 NOTE — Progress Notes (Signed)
Office Visit Note   Patient: Ruth Arellano           Date of Birth: 06-Jul-1981           MRN: [id number] Visit Date: 02/15/2021              Requested by: Gwenlyn Fudge, FNP [address] [place],  [place] [address] PCP: Gwenlyn Fudge, FNP   Assessment & Plan: Visit Diagnoses:  1. Neck pain   2. Paresthesia of arm     Plan: Accompanied by her daughter who acts as her interpreter.  No longer having any significant issues with her neck or with the numbness and tingling and paresthesias into the right upper extremity.  Has completed a course of physical therapy and has a home exercise program for her neck.  No longer wearing the splints at night as she is not having any pain or numbness and tingling.  I think she has had a combination of the neck sprain with some mild arthritis and carpal tunnel symptoms which are presently minimal at best.  I have urged her to continue with her exercise program at home and wear the splints if needed.  We will hold on any further diagnostic studies unless she has an exacerbation of any of her symptoms  Follow-Up Instructions: Return if symptoms worsen or fail to improve.   Orders:  No orders of the defined types were placed in this encounter.  No orders of the defined types were placed in this encounter.     Procedures: No procedures performed   Clinical Data: No additional findings.   Subjective: Chief Complaint  Patient presents with   Neck - Follow-up  Patient presents today for follow up on her neck and arm numbness. She has been going to physical therapy. She reports improvement.  HPI  Review of Systems   Objective: Vital Signs: There were no vitals taken for this visit.  Physical Exam Constitutional:      Appearance: She is well-developed.  Eyes:     Pupils: Pupils are equal, round, and reactive to light.  Pulmonary:     Effort: Pulmonary effort is normal.  Skin:    General: Skin is warm and dry.  Neurological:      Mental Status: She is alert and oriented to person, place, and time.  Psychiatric:        Behavior: Behavior normal.    Ortho Exam comfortable at rest in no acute distress.  Full range of motion of cervical spine without any pain or referred discomfort to either upper extremity or shoulder.  No localized areas of tenderness.  Negative Phalen's and Tinel's about the wrist.  Good grip and release both hands and good opposition of thumb to little finger  Specialty Comments:  No specialty comments available.  Imaging: No results found.   PMFS History: Patient Active Problem List   Diagnosis Date Noted   Neck pain 11/23/2020   Episode of dizziness 03/01/2020   Tinnitus of left ear 03/01/2020   Paresthesia of arm 03/01/2020   Previous cesarean delivery affecting pregnancy, antepartum 06/16/2018   History reviewed. No pertinent past medical history.  History reviewed. No pertinent family history.  Past Surgical History:  Procedure Laterality Date   APPENDECTOMY     CESAREAN SECTION     2    ECTOPIC PREGNANCY SURGERY     Social History   Occupational History   Not on file  Tobacco Use   Smoking status:  Never   Smokeless tobacco: Never  Vaping Use   Vaping Use: Never used  Substance and Sexual Activity   Alcohol use: Never   Drug use: Never   Sexual activity: Not on file

## 2021-05-30 ENCOUNTER — Ambulatory Visit: Payer: Medicaid Other | Admitting: Family Medicine

## 2021-05-30 ENCOUNTER — Encounter: Payer: Self-pay | Admitting: Family Medicine

## 2021-05-30 ENCOUNTER — Other Ambulatory Visit: Payer: Self-pay

## 2021-05-30 VITALS — BP 99/63 | HR 88 | Temp 98.1°F | Wt 124.0 lb

## 2021-05-30 DIAGNOSIS — H9312 Tinnitus, left ear: Secondary | ICD-10-CM | POA: Diagnosis not present

## 2021-05-30 DIAGNOSIS — M25562 Pain in left knee: Secondary | ICD-10-CM | POA: Diagnosis not present

## 2021-05-30 DIAGNOSIS — H9192 Unspecified hearing loss, left ear: Secondary | ICD-10-CM | POA: Diagnosis not present

## 2021-05-30 DIAGNOSIS — M25561 Pain in right knee: Secondary | ICD-10-CM | POA: Diagnosis not present

## 2021-05-30 MED ORDER — PREDNISONE 20 MG PO TABS: 20.0000 mg | ORAL_TABLET | Freq: Every day | ORAL | 0 refills | Status: AC

## 2021-05-30 NOTE — Progress Notes (Signed)
Subjective:  Patient ID: Ruth Arellano, female    DOB: May 10, 1981, 40 y.o.   MRN: [id number]  Patient Care Team: Gwenlyn Fudge, FNP as PCP - General (Family Medicine)   Chief Complaint:  Ear Pain (Left, chronic tinnitus)   HPI: Ruth Arellano is a 40 y.o. female presenting on 05/30/2021 for Ear Pain (Left, chronic tinnitus)   Pt presents today with complaints of ringing and decreased hearing in her left ear for over 20 years. She was seen in [place] for same, states nothing was done. She denies pain, fever, chills, dizziness, headaches, weakness, or nausea. She states she use to get build up of wax in her ears but this has not been a problem lately.  She also reports ongoing bilateral knee pain for several days. No known injury. Worse with certain movements and bending. She has been taking motrin without relief of symptoms.    Relevant past medical, surgical, family, and social history reviewed and updated as indicated.  Allergies and medications reviewed and updated. Data reviewed: Chart in Epic.   History reviewed. No pertinent past medical history.  Past Surgical History:  Procedure Laterality Date  . APPENDECTOMY    . CESAREAN SECTION     2   . ECTOPIC PREGNANCY SURGERY      Social History   Socioeconomic History  . Marital status: Married    Spouse name: Not on file  . Number of children: 2  . Years of education: Not on file  . Highest education level: Not on file  Occupational History  . Not on file  Tobacco Use  . Smoking status: Never  . Smokeless tobacco: Never  Vaping Use  . Vaping Use: Never used  Substance and Sexual Activity  . Alcohol use: Never  . Drug use: Never  . Sexual activity: Not on file  Other Topics Concern  . Not on file  Social History Narrative  . Not on file   Social Determinants of Health   Financial Resource Strain: Not on file  Food Insecurity: Not on file  Transportation Needs: Not on file  Physical Activity: Not on file   Stress: Not on file  Social Connections: Not on file  Intimate Partner Violence: Not on file    Outpatient Encounter Medications as of 05/30/2021  Medication Sig  . predniSONE (DELTASONE) 20 MG tablet Take 1 tablet (20 mg total) by mouth daily with breakfast for 5 days.   No facility-administered encounter medications on file as of 05/30/2021.    No Known Allergies  Review of Systems  Constitutional:  Negative for activity change, appetite change, chills, diaphoresis, fatigue, fever and unexpected weight change.  HENT:  Positive for hearing loss and tinnitus. Negative for ear discharge, ear pain, postnasal drip, rhinorrhea, sinus pressure and sinus pain.   Eyes: Negative.   Respiratory:  Negative for cough, chest tightness and shortness of breath.   Cardiovascular:  Negative for chest pain, palpitations and leg swelling.  Gastrointestinal:  Negative for abdominal pain, blood in stool, constipation, diarrhea, nausea and vomiting.  Endocrine: Negative.   Genitourinary:  Negative for decreased urine volume, difficulty urinating, dysuria, frequency and urgency.  Musculoskeletal:  Positive for arthralgias. Negative for myalgias.  Skin: Negative.   Allergic/Immunologic: Negative.   Neurological:  Negative for dizziness, light-headedness and headaches.  Hematological: Negative.   Psychiatric/Behavioral:  Negative for confusion, hallucinations, sleep disturbance and suicidal ideas.   All other systems reviewed and are negative.      Objective:  BP 99/63   Pulse 88   Temp 98.1 F (36.7 C)   Wt 124 lb (56.2 kg)   LMP 04/22/2021 (Approximate)   SpO2 96%   BMI 21.28 kg/m    Wt Readings from Last 3 Encounters:  05/30/21 124 lb (56.2 kg)  01/18/21 122 lb (55.3 kg)  12/21/20 122 lb (55.3 kg)    Physical Exam Vitals and nursing note reviewed.  Constitutional:      General: She is not in acute distress.    Appearance: Normal appearance. She is well-developed, well-groomed and  normal weight. She is not ill-appearing, toxic-appearing or diaphoretic.  HENT:     Head: Normocephalic and atraumatic.     Jaw: There is normal jaw occlusion.     Right Ear: Hearing, tympanic membrane, ear canal and external ear normal. There is no impacted cerumen.     Left Ear: Hearing, tympanic membrane, ear canal and external ear normal. There is no impacted cerumen.     Ears:     Weber exam findings: Lateralizes right.    Right Rinne: AC > BC.    Left Rinne: BC > AC.    Nose: Nose normal.     Mouth/Throat:     Lips: Pink.     Mouth: Mucous membranes are moist.     Pharynx: Oropharynx is clear. Uvula midline.  Eyes:     General: Lids are normal.     Extraocular Movements: Extraocular movements intact.     Conjunctiva/sclera: Conjunctivae normal.     Pupils: Pupils are equal, round, and reactive to light.  Neck:     Thyroid: No thyroid mass, thyromegaly or thyroid tenderness.     Vascular: No carotid bruit or JVD.     Trachea: Trachea and phonation normal.  Cardiovascular:     Rate and Rhythm: Normal rate and regular rhythm.     Chest Wall: PMI is not displaced.     Pulses: Normal pulses.     Heart sounds: Normal heart sounds. No murmur heard.   No friction rub. No gallop.  Pulmonary:     Effort: Pulmonary effort is normal. No respiratory distress.     Breath sounds: Normal breath sounds. No wheezing.  Abdominal:     General: There is no distension or abdominal bruit.     Palpations: There is no hepatomegaly or splenomegaly.     Tenderness: There is no abdominal tenderness. There is no right CVA tenderness or left CVA tenderness.     Hernia: No hernia is present.  Musculoskeletal:        General: No swelling, tenderness, deformity or signs of injury. Normal range of motion.     Cervical back: Normal range of motion and neck supple.     Right upper leg: Normal.     Left upper leg: Normal.     Right knee: Normal.     Left knee: Normal.     Right lower leg: Normal. No  edema.     Left lower leg: Normal. No edema.  Lymphadenopathy:     Cervical: No cervical adenopathy.  Skin:    General: Skin is warm and dry.     Capillary Refill: Capillary refill takes less than 2 seconds.     Coloration: Skin is not cyanotic, jaundiced or pale.     Findings: No rash.  Neurological:     General: No focal deficit present.     Mental Status: She is alert and oriented to person, place, and time.  Cranial Nerves: Cranial nerves are intact.     Sensory: Sensation is intact.     Motor: Motor function is intact.     Coordination: Coordination is intact.     Gait: Gait is intact.     Deep Tendon Reflexes: Reflexes are normal and symmetric.  Psychiatric:        Attention and Perception: Attention and perception normal.        Mood and Affect: Mood and affect normal.        Speech: Speech normal.        Behavior: Behavior normal. Behavior is cooperative.        Thought Content: Thought content normal.        Cognition and Memory: Cognition and memory normal.        Judgment: Judgment normal.    Results for orders placed or performed in visit on 01/05/21  HM PAP SMEAR  Result Value Ref Range   HM Pap smear Negative (Care Everywhere)        Pertinent labs & imaging results that were available during my care of the patient were reviewed by me and considered in my medical decision making.  Assessment & Plan:  Analese was seen today for ear pain.  Diagnoses and all orders for this visit:  Tinnitus of left ear Decreased hearing of left ear Ongoing for several years. No indications of infection or effusion in office today. Weber lateralizes to left, Rinne BC>AC left, AC>BC right. Likely sensorineural hearing loss. Referral to Audiology placed.  -     Ambulatory referral to Audiology  Acute pain of both knees No injury or deformity. Will burst with steroids for pain as pt has been taking motrin without relief of symptoms. Pt aware to report continued or worsening  symptoms.  -     predniSONE (DELTASONE) 20 MG tablet; Take 1 tablet (20 mg total) by mouth daily with breakfast for 5 days.    Continue all other maintenance medications.  Follow up plan: Return if symptoms worsen or fail to improve.   Continue healthy lifestyle choices, including diet (rich in fruits, vegetables, and lean proteins, and low in salt and simple carbohydrates) and exercise (at least 30 minutes of moderate physical activity daily).  Educational handout given for tinnitus, hearing loss  The above assessment and management plan was discussed with the patient. The patient verbalized understanding of and has agreed to the management plan. Patient is aware to call the clinic if they develop any new symptoms or if symptoms persist or worsen. Patient is aware when to return to the clinic for a follow-up visit. Patient educated on when it is appropriate to go to the emergency department.   Kari Baars, FNP-C [facility] [phone]

## 2021-07-03 ENCOUNTER — Other Ambulatory Visit: Payer: Self-pay

## 2021-07-03 ENCOUNTER — Emergency Department (HOSPITAL_COMMUNITY): Payer: Medicaid Other

## 2021-07-03 ENCOUNTER — Ambulatory Visit: Payer: Medicaid Other | Admitting: Audiologist

## 2021-07-03 ENCOUNTER — Emergency Department (HOSPITAL_COMMUNITY)
Admission: EM | Admit: 2021-07-03 | Discharge: 2021-07-03 | Disposition: A | Discharge status: Home / Self Care | Payer: Medicaid Other | Attending: Emergency Medicine | Admitting: Emergency Medicine

## 2021-07-03 ENCOUNTER — Encounter (HOSPITAL_COMMUNITY): Payer: Self-pay

## 2021-07-03 DIAGNOSIS — M542 Cervicalgia: Secondary | ICD-10-CM | POA: Diagnosis not present

## 2021-07-03 DIAGNOSIS — S4991XA Unspecified injury of right shoulder and upper arm, initial encounter: Secondary | ICD-10-CM | POA: Diagnosis present

## 2021-07-03 DIAGNOSIS — M25521 Pain in right elbow: Secondary | ICD-10-CM | POA: Diagnosis not present

## 2021-07-03 DIAGNOSIS — S46911A Strain of unspecified muscle, fascia and tendon at shoulder and upper arm level, right arm, initial encounter: Secondary | ICD-10-CM | POA: Insufficient documentation

## 2021-07-03 DIAGNOSIS — Y92410 Unspecified street and highway as the place of occurrence of the external cause: Secondary | ICD-10-CM | POA: Insufficient documentation

## 2021-07-03 LAB — POC URINE PREG, ED: Preg Test, Ur: NEGATIVE

## 2021-07-03 MED ORDER — IBUPROFEN 400 MG PO TABS
600.0000 mg | ORAL_TABLET | Freq: Once | ORAL | Status: AC
  Administered 2021-07-03: 600 mg via ORAL
  Filled 2021-07-03: qty 1
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
## 2021-07-03 NOTE — Discharge Instructions (Signed)
Follow-up with your primary care doctor for recheck later this week.  Recommend Tylenol Motrin as needed for pain control.  Come back to ER if you develop new or worsening pains or other new concerning symptom.
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
## 2021-07-14 ENCOUNTER — Ambulatory Visit: Payer: Medicaid Other | Admitting: Family Medicine

## 2021-07-14 ENCOUNTER — Encounter: Payer: Self-pay | Admitting: Family Medicine

## 2021-07-14 ENCOUNTER — Other Ambulatory Visit: Payer: Self-pay

## 2021-07-14 VITALS — BP 105/67 | HR 86 | Temp 98.2°F | Ht 64.0 in | Wt 126.8 lb

## 2021-07-14 DIAGNOSIS — M542 Cervicalgia: Secondary | ICD-10-CM

## 2021-07-14 MED ORDER — METHOCARBAMOL 500 MG PO TABS: 500.0000 mg | ORAL_TABLET | Freq: Three times a day (TID) | ORAL | 1 refills | Status: DC | PRN

## 2021-07-14 MED ORDER — METHYLPREDNISOLONE ACETATE 80 MG/ML IJ SUSP
80.0000 mg | Freq: Once | INTRAMUSCULAR | Status: AC
  Administered 2021-07-14: 80 mg via INTRAMUSCULAR

## 2021-07-14 MED ORDER — MELOXICAM 7.5 MG PO TABS: 7.5000 mg | ORAL_TABLET | Freq: Every day | ORAL | 0 refills | Status: DC
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
## 2021-07-16 ENCOUNTER — Encounter: Payer: Self-pay | Admitting: Family Medicine

## 2021-07-17 ENCOUNTER — Ambulatory Visit: Payer: Medicaid Other | Admitting: Audiologist

## 2021-07-26 ENCOUNTER — Ambulatory Visit: Payer: Medicaid Other | Attending: Family Medicine | Admitting: Audiologist

## 2021-07-26 ENCOUNTER — Other Ambulatory Visit: Payer: Self-pay

## 2021-07-26 DIAGNOSIS — H9072 Mixed conductive and sensorineural hearing loss, unilateral, left ear, with unrestricted hearing on the contralateral side: Secondary | ICD-10-CM | POA: Diagnosis present
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
## 2021-07-31 ENCOUNTER — Telehealth: Payer: Self-pay | Admitting: Family Medicine

## 2021-07-31 DIAGNOSIS — H9192 Unspecified hearing loss, left ear: Secondary | ICD-10-CM

## 2021-07-31 NOTE — Telephone Encounter (Signed)
Patient was previously referred to an audiologist for evaluation of tinnitus and decreased hearing of left ear. I received the following message: Results of today's hearing evaluation show that the patient needs to be seen by an Ear Nose and Throat Physician. See the full report for details. Please send a referral with the attached audiogram to an ENT Physician. The Audiogram is under the Media File.   If you have questions or concerns please message me, Ammie Ferrier Au.D.  Thank you again  Ammie Ferrier Au.Benito Mccreedy  Audiologist  [facility]   Please make sure patient is okay with the referral. Does she have a preference where she is referred?

## 2021-08-01 NOTE — Telephone Encounter (Signed)
Attempted to contact patient - NA °

## 2021-08-01 NOTE — Telephone Encounter (Signed)
Pt ok with referral to ENT. She needs a Monday morning and interpreter.

## 2021-08-02 NOTE — Telephone Encounter (Signed)
Referral placed.

## 2021-08-02 NOTE — Addendum Note (Signed)
Addended by: [name] F on: 08/02/2021 08:00 AM   Modules accepted: Orders

## 2021-08-04 ENCOUNTER — Telehealth: Payer: Self-pay | Admitting: Family Medicine

## 2021-08-22 NOTE — Telephone Encounter (Signed)
Please call patient with this information

## 2021-08-22 NOTE — Telephone Encounter (Signed)
Aware and verbalizes understanding.  

## 2021-08-22 NOTE — Telephone Encounter (Signed)
Patient calling about appt for  ENT. Please call.

## 2021-08-22 NOTE — Telephone Encounter (Signed)
Attempted to return call - NA 

## 2021-10-31 ENCOUNTER — Ambulatory Visit: Payer: Medicaid Other | Admitting: Family Medicine

## 2021-10-31 ENCOUNTER — Encounter: Payer: Self-pay | Admitting: Family Medicine

## 2021-10-31 VITALS — BP 106/65 | HR 80 | Temp 99.2°F | Ht 64.0 in | Wt 129.8 lb

## 2021-10-31 DIAGNOSIS — N926 Irregular menstruation, unspecified: Secondary | ICD-10-CM

## 2021-10-31 DIAGNOSIS — Z30011 Encounter for initial prescription of contraceptive pills: Secondary | ICD-10-CM | POA: Diagnosis not present

## 2021-10-31 LAB — PREGNANCY, URINE: Preg Test, Ur: NEGATIVE

## 2021-10-31 MED ORDER — LEVONORGEST-ETH ESTRAD 91-DAY 0.15-0.03 &0.01 MG PO TABS: 1.0000 | ORAL_TABLET | Freq: Every day | ORAL | 1 refills | Status: DC

## 2021-10-31 NOTE — Progress Notes (Signed)
Translator used for the duration of the visit: [name] #[id number] Assessment & Plan:  1. Encounter for oral contraception initial prescription - Pregnancy, urine (negative) - Levonorgestrel-Ethinyl Estradiol (AMETHIA) 0.15-0.03 &0.01 MG tablet; Take 1 tablet by mouth daily.  Dispense: 91 tablet; Refill: 1  2. Irregular menstrual cycle New start.  - Levonorgestrel-Ethinyl Estradiol (AMETHIA) 0.15-0.03 &0.01 MG tablet; Take 1 tablet by mouth daily.  Dispense: 91 tablet; Refill: 1   Return in about 3 months (around 01/28/2022) for annual physical.  Ruth Boston, MSN, APRN, FNP-C [facility]  Subjective:    Patient ID: Ruth Arellano, female    DOB: 24-Jun-1981, 41 y.o.   MRN: [id number]  Patient Care Team: Gwenlyn Fudge, FNP as PCP - General (Family Medicine)   Chief Complaint:  Chief Complaint  Patient presents with   Menstrual Problem    Patient states in the last year her menstrual has not been normal. Her cycles are not monthly but every few months and sometimes not much bleeding.     HPI: Ruth Arellano is a 41 y.o. female presenting on 10/31/2021 for Menstrual Problem (Patient states in the last year her menstrual has not been normal. Her cycles are not monthly but every few months and sometimes not much bleeding. )  Patient reports she has a period every 2-3 months and her flow is very light. She use to have a regular monthly cycle before she had children, except once this happened before and she had to be regulated with birth control. This has been going on for the past year. Mom went through menopause in her 9s. She has four older sisters and one is going through menopause in her early 43s.   New complaints: None   Social history:  Relevant past medical, surgical, family and social history reviewed and updated as indicated. Interim medical history since our last visit reviewed.  Allergies and medications reviewed and updated.  DATA REVIEWED: CHART IN  EPIC  ROS: Negative unless specifically indicated above in HPI.   No current outpatient medications on file.   No Known Allergies History reviewed. No pertinent past medical history.  Past Surgical History:  Procedure Laterality Date   APPENDECTOMY     CESAREAN SECTION     2    ECTOPIC PREGNANCY SURGERY      Social History   Socioeconomic History   Marital status: Married    Spouse name: Not on file   Number of children: 2   Years of education: Not on file   Highest education level: Not on file  Occupational History   Not on file  Tobacco Use   Smoking status: Never   Smokeless tobacco: Never  Vaping Use   Vaping Use: Never used  Substance and Sexual Activity   Alcohol use: Never   Drug use: Never   Sexual activity: Not on file  Other Topics Concern   Not on file  Social History Narrative   Not on file   Social Determinants of Health   Financial Resource Strain: Not on file  Food Insecurity: Not on file  Transportation Needs: Not on file  Physical Activity: Not on file  Stress: Not on file  Social Connections: Not on file  Intimate Partner Violence: Not on file        Objective:    BP 106/65    Pulse 80    Temp 99.2 F (37.3 C) (Temporal)    Ht 5\' 4"  (1.626 m)    Wt 129  lb 12.8 oz (58.9 kg)    LMP 10/04/2021 (Approximate)    BMI 22.28 kg/m   Wt Readings from Last 3 Encounters:  10/31/21 129 lb 12.8 oz (58.9 kg)  07/14/21 126 lb 12.8 oz (57.5 kg)  07/03/21 123 lb 14.4 oz (56.2 kg)    Physical Exam Vitals reviewed.  Constitutional:      General: She is not in acute distress.    Appearance: Normal appearance. She is not ill-appearing, toxic-appearing or diaphoretic.  HENT:     Head: Normocephalic and atraumatic.  Eyes:     General: No scleral icterus.       Right eye: No discharge.        Left eye: No discharge.     Conjunctiva/sclera: Conjunctivae normal.  Cardiovascular:     Rate and Rhythm: Normal rate.  Pulmonary:     Effort: Pulmonary  effort is normal. No respiratory distress.  Musculoskeletal:        General: Normal range of motion.     Cervical back: Normal range of motion.  Skin:    General: Skin is warm and dry.     Capillary Refill: Capillary refill takes less than 2 seconds.  Neurological:     General: No focal deficit present.     Mental Status: She is alert and oriented to person, place, and time. Mental status is at baseline.  Psychiatric:        Mood and Affect: Mood normal.        Behavior: Behavior normal.        Thought Content: Thought content normal.        Judgment: Judgment normal.    Lab Results  Component Value Date   TSH 0.953 03/01/2020   Lab Results  Component Value Date   WBC 4.5 03/01/2020   HGB 12.8 03/01/2020   HCT 38.2 03/01/2020   MCV 94 03/01/2020   PLT 242 03/01/2020   Lab Results  Component Value Date   NA 139 03/01/2020   K 4.4 03/01/2020   CO2 25 03/01/2020   GLUCOSE 89 03/01/2020   BUN 14 03/01/2020   CREATININE 0.67 03/01/2020   BILITOT 0.5 03/01/2020   ALKPHOS 38 (L) 03/01/2020   AST 20 03/01/2020   ALT 18 03/01/2020   PROT 7.4 03/01/2020   ALBUMIN 4.6 03/01/2020   CALCIUM 9.4 03/01/2020   No results found for: CHOL No results found for: HDL No results found for: LDLCALC No results found for: TRIG No results found for: CHOLHDL No results found for: EXBM8U

## 2021-12-12 ENCOUNTER — Ambulatory Visit: Payer: Medicaid Other | Admitting: Nurse Practitioner

## 2021-12-12 ENCOUNTER — Encounter: Payer: Self-pay | Admitting: Nurse Practitioner

## 2021-12-12 VITALS — BP 100/63 | HR 77 | Temp 98.2°F | Ht 65.35 in | Wt 130.0 lb

## 2021-12-12 DIAGNOSIS — G5601 Carpal tunnel syndrome, right upper limb: Secondary | ICD-10-CM

## 2021-12-12 MED ORDER — METHOCARBAMOL 500 MG PO TABS: 500.0000 mg | ORAL_TABLET | Freq: Four times a day (QID) | ORAL | 1 refills | Status: DC

## 2021-12-12 MED ORDER — IBUPROFEN 600 MG PO TABS
600.0000 mg | ORAL_TABLET | Freq: Three times a day (TID) | ORAL | 0 refills | Status: DC | PRN
Start: 2021-12-12 — End: 2022-01-02

## 2021-12-12 NOTE — Patient Instructions (Signed)
?????? ?  Wrist Pain, Adult ????????????????????????? ?????????????????? ???????? ???????????????????????? ?????????????????????? ??????????????????? ??????????????????????????????????????????????????????????????????????????????????????????????????????? ?????????? ????????????? ?????????????????????????????????????????????????????????????????? ??????????????????????????????????? ?????????????????????????????????? ??????????? ???????????? ???????? ???????????????? ??????????? ? ????????????????????? ???????????????????????????????? ???????????? ???????????????????????????? ????? 20 ????? 2-3 ?? ??????????????????? ??????????????????????????????? ??? ????????????????????????? ??????????????????????????????????????????????? ???????????????????????????????? ??????????????????? ????? ?????????? ?????????????????????????? ??????????????????????????????? ????????????????????? ??????????????????????????? ????????????????? ???????????????????? ???????????? ???????? ??????????????? ???????????? ??????????????????? ??????????? ??? ???????????????? ???????????????????????????? ?????????????????? ??????????????????????????????????????????????? ????????????????????????????????????????????????? ?Document Revised: 09/22/2019 Document Reviewed: 09/22/2019 ?Elsevier Patient Education ? 2022 Elsevier Inc. ? ?

## 2021-12-12 NOTE — Progress Notes (Signed)
? ?Acute Office Visit ? ?Subjective:  ? ? Patient ID: Ruth Arellano, female    DOB: 1981/01/22, 41 y.o.   MRN: [id number] ? ?Chief Complaint  ?Patient presents with  ? Hand Pain  ? ? ?Hand Pain  ?The incident occurred more than 1 week ago. The incident occurred at home. The injury mechanism was a fall. The pain is present in the right wrist and right hand. The quality of the pain is described as aching. The pain radiates to the right arm and right hand. The pain is at a severity of 5/10. The pain is moderate. The pain has been Constant since the incident. Associated symptoms include numbness and tingling. The symptoms are aggravated by movement and lifting.  ?Pain aggravated after patient returned back to work in [employer] ? ?History reviewed. No pertinent past medical history. ? ?Past Surgical History:  ?Procedure Laterality Date  ? APPENDECTOMY    ? CESAREAN SECTION    ? 2   ? ECTOPIC PREGNANCY SURGERY    ? ? ?History reviewed. No pertinent family history. ? ?Social History  ? ?Socioeconomic History  ? Marital status: Married  ?  Spouse name: Not on file  ? Number of children: 2  ? Years of education: Not on file  ? Highest education level: Not on file  ?Occupational History  ? Not on file  ?Tobacco Use  ? Smoking status: Never  ? Smokeless tobacco: Never  ?Vaping Use  ? Vaping Use: Never used  ?Substance and Sexual Activity  ? Alcohol use: Never  ? Drug use: Never  ? Sexual activity: Not on file  ?Other Topics Concern  ? Not on file  ?Social History Narrative  ? Not on file  ? ?Social Determinants of Health  ? ?Financial Resource Strain: Not on file  ?Food Insecurity: Not on file  ?Transportation Needs: Not on file  ?Physical Activity: Not on file  ?Stress: Not on file  ?Social Connections: Not on file  ?Intimate Partner Violence: Not on file  ? ? ?Outpatient Medications Prior to Visit  ?Medication Sig Dispense Refill  ? Levonorgestrel-Ethinyl Estradiol (AMETHIA) 0.15-0.03 &0.01 MG tablet Take 1 tablet by mouth  daily. (Patient not taking: Reported on 12/12/2021) 91 tablet 1  ? ?No facility-administered medications prior to visit.  ? ? ?No Known Allergies ? ?Review of Systems  ?Constitutional: Negative.   ?HENT: Negative.    ?Eyes: Negative.   ?Respiratory: Negative.    ?Cardiovascular: Negative.   ?Gastrointestinal: Negative.   ?Musculoskeletal: Negative.   ?Skin:  Negative for rash.  ?Neurological:  Positive for tingling and numbness.  ?All other systems reviewed and are negative. ? ?   ?Objective:  ?  ?Physical Exam ?Vitals and nursing note reviewed.  ?Constitutional:   ?   Appearance: Normal appearance.  ?HENT:  ?   Head: Normocephalic.  ?   Nose: Nose normal.  ?Eyes:  ?   Conjunctiva/sclera: Conjunctivae normal.  ?Cardiovascular:  ?   Rate and Rhythm: Normal rate and regular rhythm.  ?   Pulses: Normal pulses.  ?   Heart sounds: Normal heart sounds.  ?Pulmonary:  ?   Effort: Pulmonary effort is normal.  ?   Breath sounds: Normal breath sounds.  ?Musculoskeletal:  ?   Right wrist: Tenderness present. No swelling, snuff box tenderness or crepitus. Decreased range of motion. Normal pulse.  ?     Arms: ? ?   Comments: Right wrist, right hand and arm pain  ?Skin: ?  General: Skin is warm.  ?   Findings: No rash.  ?Neurological:  ?   Mental Status: She is alert and oriented to person, place, and time.  ?Psychiatric:     ?   Mood and Affect: Mood normal.     ?   Behavior: Behavior normal.  ? ? ?BP 100/63   Pulse 77   Temp 98.2 ?F (36.8 ?C)   Ht 5' 5.35" (1.66 m)   Wt 130 lb (59 kg)   LMP 11/15/2021 (Exact Date)   SpO2 97%   BMI 21.40 kg/m?  ?Wt Readings from Last 3 Encounters:  ?12/12/21 130 lb (59 kg)  ?10/31/21 129 lb 12.8 oz (58.9 kg)  ?07/14/21 126 lb 12.8 oz (57.5 kg)  ? ? ?Health Maintenance Due  ?Topic Date Due  ? HIV Screening  Never done  ? Hepatitis C Screening  Never done  ? PAP SMEAR-Modifier  10/27/2021  ? ? ?There are no preventive care reminders to display for this patient. ? ? ?Lab Results  ?Component  Value Date  ? TSH 0.953 03/01/2020  ? ?Lab Results  ?Component Value Date  ? WBC 4.5 03/01/2020  ? HGB 12.8 03/01/2020  ? HCT 38.2 03/01/2020  ? MCV 94 03/01/2020  ? PLT 242 03/01/2020  ? ?Lab Results  ?Component Value Date  ? NA 139 03/01/2020  ? K 4.4 03/01/2020  ? CO2 25 03/01/2020  ? GLUCOSE 89 03/01/2020  ? BUN 14 03/01/2020  ? CREATININE 0.67 03/01/2020  ? BILITOT 0.5 03/01/2020  ? ALKPHOS 38 (L) 03/01/2020  ? AST 20 03/01/2020  ? ALT 18 03/01/2020  ? PROT 7.4 03/01/2020  ? ALBUMIN 4.6 03/01/2020  ? CALCIUM 9.4 03/01/2020  ? ?No results found for: CHOL ?No results found for: HDL ?No results found for: LDLCALC ?No results found for: TRIG ?No results found for: CHOLHDL ?No results found for: HGBA1C ? ?   ?Assessment & Plan:  ?Take medication as prescribed. ?-Wrist joint ?-We are a wrist brace ?-Referral completed physical therapy ?-Follow-up with worsening unresolved symptoms. ?Problem List Items Addressed This Visit   ?None ?Visit Diagnoses   ? ? Carpal tunnel syndrome of right wrist    -  Primary  ? Relevant Medications  ? methocarbamol (ROBAXIN) 500 MG tablet  ? ibuprofen (ADVIL) 600 MG tablet  ? Other Relevant Orders  ? Ambulatory referral to Physical Therapy  ? ?  ? ? ? ?Meds ordered this encounter  ?Medications  ? methocarbamol (ROBAXIN) 500 MG tablet  ?  Sig: Take 1 tablet (500 mg total) by mouth 4 (four) times daily.  ?  Dispense:  30 tablet  ?  Refill:  1  ?  Order Specific Question:   Supervising Provider  ?  AnswerMechele Claude [[id number]]  ? ibuprofen (ADVIL) 600 MG tablet  ?  Sig: Take 1 tablet (600 mg total) by mouth every 8 (eight) hours as needed.  ?  Dispense:  30 tablet  ?  Refill:  0  ?  Order Specific Question:   Supervising Provider  ?  AnswerMechele Claude [[id number]]  ? ? ? ?Daryll Drown, NP ? ?

## 2022-01-02 ENCOUNTER — Encounter: Payer: Self-pay | Admitting: Family Medicine

## 2022-01-02 ENCOUNTER — Ambulatory Visit: Payer: Medicaid Other | Admitting: Family Medicine

## 2022-01-02 VITALS — BP 109/69 | HR 94 | Temp 98.3°F | Ht 65.35 in | Wt 126.4 lb

## 2022-01-02 DIAGNOSIS — R202 Paresthesia of skin: Secondary | ICD-10-CM | POA: Diagnosis not present

## 2022-01-02 MED ORDER — IBUPROFEN 600 MG PO TABS: 600.0000 mg | ORAL_TABLET | Freq: Three times a day (TID) | ORAL | 2 refills | Status: DC | PRN

## 2022-01-02 MED ORDER — PREDNISONE 20 MG PO TABS: 40.0000 mg | ORAL_TABLET | Freq: Every day | ORAL | 0 refills | Status: AC

## 2022-01-02 MED ORDER — METHOCARBAMOL 500 MG PO TABS: 500.0000 mg | ORAL_TABLET | Freq: Four times a day (QID) | ORAL | 1 refills | Status: DC

## 2022-01-02 NOTE — Progress Notes (Signed)
? ?Assessment & Plan:  ?1. Paresthesias in right hand ?Offered patient a referral to an orthopedic, but she would like to complete physical therapy again first.  Discussed I am not comfortable saying she is disabled due to her symptoms, but that she needs to see a specialist.  Refilled ibuprofen and methocarbamol.  Started a 5-day course of prednisone. ?- predniSONE (DELTASONE) 20 MG tablet; Take 2 tablets (40 mg total) by mouth daily with breakfast for 5 days.  Dispense: 10 tablet; Refill: 0 ?- ibuprofen (ADVIL) 600 MG tablet; Take 1 tablet (600 mg total) by mouth every 8 (eight) hours as needed.  Dispense: 60 tablet; Refill: 2 ?- methocarbamol (ROBAXIN) 500 MG tablet; Take 1 tablet (500 mg total) by mouth 4 (four) times daily.  Dispense: 60 tablet; Refill: 1 ?- Ambulatory referral to Physical Therapy ? ? ?Follow up plan: Return if symptoms worsen or fail to improve. ? ?[name], MSN, APRN, FNP-C ?[facility] ? ?Subjective:  ? ?Patient ID: Ruth Arellano, female    DOB: [birth year], 41 y.o.   MRN: [id number] ? ?HPI: ?Ruth Arellano is a 41 y.o. female presenting on 01/02/2022 for Hand Pain ? ?Patient is here for continued right hand pain that flared up after a fall early last month.  She reports the pain has actually been present for over a year.  She has done physical therapy in the past and reports it improves the pain.  She has been taking ibuprofen and Robaxin which she finds very helpful.  She also finds it helpful to wear a wrist splint.  She is questioning if she qualifies as being disabled. ? ? ?ROS: Negative unless specifically indicated above in HPI.  ? ?Relevant past medical history reviewed and updated as indicated.  ? ?Allergies and medications reviewed and updated. ? ? ?Current Outpatient Medications:  ?  ibuprofen (ADVIL) 600 MG tablet, Take 1 tablet (600 mg total) by mouth every 8 (eight) hours as needed., Disp: 30 tablet, Rfl: 0 ?  methocarbamol (ROBAXIN) 500 MG tablet, Take 1  tablet (500 mg total) by mouth 4 (four) times daily., Disp: 30 tablet, Rfl: 1 ?  Levonorgestrel-Ethinyl Estradiol (AMETHIA) 0.15-0.03 &0.01 MG tablet, Take 1 tablet by mouth daily. (Patient not taking: Reported on 01/02/2022), Disp: 91 tablet, Rfl: 1 ? ?No Known Allergies ? ?Objective:  ? ?BP 109/69   Pulse 94   Temp 98.3 ?F (36.8 ?C) (Temporal)   Ht 5' 5.35" (1.66 m)   Wt 126 lb 6.4 oz (57.3 kg)   BMI 20.81 kg/m?   ? ?Physical Exam ?Vitals reviewed.  ?Constitutional:   ?   General: She is not in acute distress. ?   Appearance: Normal appearance. She is not ill-appearing, toxic-appearing or diaphoretic.  ?HENT:  ?   Head: Normocephalic and atraumatic.  ?Eyes:  ?   General: No scleral icterus.    ?   Right eye: No discharge.     ?   Left eye: No discharge.  ?   Conjunctiva/sclera: Conjunctivae normal.  ?Cardiovascular:  ?   Rate and Rhythm: Normal rate.  ?Pulmonary:  ?   Effort: Pulmonary effort is normal. No respiratory distress.  ?Musculoskeletal:     ?   General: Normal range of motion.  ?   Right hand: Tenderness (generalized to the top of her hand) present. No swelling, deformity or lacerations. Normal range of motion.  ?   Cervical back: Normal range of motion.  ?   Comments: Negative Tinel and Phalen  signs.  ?Skin: ?   General: Skin is warm and dry.  ?   Capillary Refill: Capillary refill takes less than 2 seconds.  ?Neurological:  ?   General: No focal deficit present.  ?   Mental Status: She is alert and oriented to person, place, and time. Mental status is at baseline.  ?Psychiatric:     ?   Mood and Affect: Mood normal.     ?   Behavior: Behavior normal.     ?   Thought Content: Thought content normal.     ?   Judgment: Judgment normal.  ? ? ? ? ? ? ?

## 2022-01-24 ENCOUNTER — Encounter: Payer: Self-pay | Admitting: Family Medicine

## 2022-01-24 ENCOUNTER — Other Ambulatory Visit (HOSPITAL_COMMUNITY)
Admission: RE | Admit: 2022-01-24 | Discharge: 2022-01-24 | Disposition: A | Discharge status: Home / Self Care | Payer: Medicaid Other | Source: Ambulatory Visit | Attending: Family Medicine | Admitting: Family Medicine

## 2022-01-24 ENCOUNTER — Ambulatory Visit (INDEPENDENT_AMBULATORY_CARE_PROVIDER_SITE_OTHER): Payer: Medicaid Other | Admitting: Family Medicine

## 2022-01-24 ENCOUNTER — Other Ambulatory Visit: Payer: Self-pay | Admitting: Family Medicine

## 2022-01-24 VITALS — BP 105/68 | HR 75 | Temp 97.9°F | Ht 65.35 in | Wt 128.0 lb

## 2022-01-24 DIAGNOSIS — Z113 Encounter for screening for infections with a predominantly sexual mode of transmission: Secondary | ICD-10-CM

## 2022-01-24 DIAGNOSIS — Z124 Encounter for screening for malignant neoplasm of cervix: Secondary | ICD-10-CM

## 2022-01-24 DIAGNOSIS — Z0001 Encounter for general adult medical examination with abnormal findings: Secondary | ICD-10-CM | POA: Diagnosis not present

## 2022-01-24 DIAGNOSIS — N926 Irregular menstruation, unspecified: Secondary | ICD-10-CM | POA: Diagnosis not present

## 2022-01-24 DIAGNOSIS — Z1231 Encounter for screening mammogram for malignant neoplasm of breast: Secondary | ICD-10-CM

## 2022-01-24 DIAGNOSIS — Z1151 Encounter for screening for human papillomavirus (HPV): Secondary | ICD-10-CM

## 2022-01-24 DIAGNOSIS — Z0000 Encounter for general adult medical examination without abnormal findings: Secondary | ICD-10-CM

## 2022-01-24 LAB — PREGNANCY, URINE: Preg Test, Ur: NEGATIVE

## 2022-01-24 MED ORDER — LO LOESTRIN FE 1 MG-10 MCG / 10 MCG PO TABS: 1.0000 | ORAL_TABLET | Freq: Every day | ORAL | 11 refills | Status: DC

## 2022-01-24 NOTE — Progress Notes (Signed)
Translator used for the duration of the visit: [name]  Assessment & Plan:  1. Well adult exam Preventive health education provided.  - CBC with Differential/Platelet - CMP14+EGFR - Lipid panel - Hepatitis C antibody (reflex, frozen specimen) - HIV Antibody (routine testing w rflx)  2. Irregular menstrual cycle - TSH - T4, free - Prolactin - FSH/LH - Pregnancy, urine  3. Screening for cervical cancer - Cytology - PAP  4. Routine screening for STI (sexually transmitted infection) - Cytology - PAP  5. Screening for human papillomavirus (HPV) - Cytology - PAP   Follow-up: Return in about 1 year (around 01/25/2023) for annual physical.   Hendricks Limes, MSN, APRN, FNP-C [facility]  Subjective:  Patient ID: Ruth Arellano, female    DOB: Sep 24, 1980  Age: 41 y.o. MRN: [id number]  Patient Care Team: Loman Brooklyn, FNP as PCP - General (Family Medicine)   CC:  Chief Complaint  Patient presents with   Gynecologic Exam    HPI Ruth Arellano presents for her annual physical.   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[employer], Marital status: married, Substance use: none Diet: regular, Exercise: sometimes Last eye exam: within the last year Last dental exam: every 6 months Last mammogram: never - agreeable in scheduling Last pap smear: 10/27/2018 Hepatitis C Screening: agreeable in checking Immunizations:  Tdap Vaccine: up to date  COVID-19 Vaccine: declined  DEPRESSION SCREENING    01/24/2022    9:15 AM 12/12/2021    3:15 PM 05/30/2021   10:22 AM 10/27/2020    8:27 AM 03/01/2020   11:19 AM  PHQ 2/9 Scores  PHQ - 2 Score 0 0 0 0 2  PHQ- 9 Score 0 0 0  2    Patient is requesting lab work and oral birth control due to irregular periods. She did have a light period in April, but has not had one in May.    Review of Systems  Constitutional:  Negative for chills, fever, malaise/fatigue and weight loss.  HENT:  Negative for congestion, ear discharge, ear pain,  nosebleeds, sinus pain, sore throat and tinnitus.   Eyes:  Negative for blurred vision, double vision, pain, discharge and redness.  Respiratory:  Negative for cough, shortness of breath and wheezing.   Cardiovascular:  Negative for chest pain, palpitations and leg swelling.  Gastrointestinal:  Negative for abdominal pain, constipation, diarrhea, heartburn, nausea and vomiting.  Genitourinary:  Negative for dysuria, frequency and urgency.  Musculoskeletal:  Negative for myalgias.  Skin:  Negative for rash.  Neurological:  Negative for dizziness, seizures, weakness and headaches.  Psychiatric/Behavioral:  Negative for depression, substance abuse and suicidal ideas. The patient is not nervous/anxious.     Current Outpatient Medications:    ibuprofen (ADVIL) 600 MG tablet, Take 1 tablet (600 mg total) by mouth every 8 (eight) hours as needed., Disp: 60 tablet, Rfl: 2   methocarbamol (ROBAXIN) 500 MG tablet, Take 1 tablet (500 mg total) by mouth 4 (four) times daily., Disp: 60 tablet, Rfl: 1  No Known Allergies  History reviewed. No pertinent past medical history.  Past Surgical History:  Procedure Laterality Date   APPENDECTOMY     CESAREAN SECTION     2    ECTOPIC PREGNANCY SURGERY      History reviewed. No pertinent family history.  Social History   Socioeconomic History   Marital status: Married    Spouse name: Not on file   Number of children: 2   Years of education: Not on file  Highest education level: Not on file  Occupational History   Not on file  Tobacco Use   Smoking status: Never   Smokeless tobacco: Never  Vaping Use   Vaping Use: Never used  Substance and Sexual Activity   Alcohol use: Never   Drug use: Never   Sexual activity: Not on file  Other Topics Concern   Not on file  Social History Narrative   Not on file   Social Determinants of Health   Financial Resource Strain: Not on file  Food Insecurity: Not on file  Transportation Needs: Not on  file  Physical Activity: Not on file  Stress: Not on file  Social Connections: Not on file  Intimate Partner Violence: Not on file      Objective:    BP 105/68   Pulse 75   Temp 97.9 F (36.6 C) (Temporal)   Ht 5' 5.35" (1.66 m)   Wt 128 lb (58.1 kg)   LMP 12/26/2021   SpO2 100%   BMI 21.07 kg/m   Wt Readings from Last 3 Encounters:  01/24/22 128 lb (58.1 kg)  01/02/22 126 lb 6.4 oz (57.3 kg)  12/12/21 130 lb (59 kg)    Physical Exam Vitals reviewed.  Constitutional:      General: She is not in acute distress.    Appearance: Normal appearance. She is normal weight. She is not ill-appearing, toxic-appearing or diaphoretic.  HENT:     Head: Normocephalic and atraumatic.     Right Ear: Tympanic membrane, ear canal and external ear normal. There is no impacted cerumen.     Left Ear: Tympanic membrane, ear canal and external ear normal. There is no impacted cerumen.     Nose: Nose normal. No congestion or rhinorrhea.     Mouth/Throat:     Mouth: Mucous membranes are moist.     Pharynx: Oropharynx is clear. No oropharyngeal exudate or posterior oropharyngeal erythema.  Eyes:     General: No scleral icterus.       Right eye: No discharge.        Left eye: No discharge.     Conjunctiva/sclera: Conjunctivae normal.     Pupils: Pupils are equal, round, and reactive to light.  Cardiovascular:     Rate and Rhythm: Normal rate and regular rhythm.     Heart sounds: Normal heart sounds. No murmur heard.   No friction rub. No gallop.  Pulmonary:     Effort: Pulmonary effort is normal. No respiratory distress.     Breath sounds: Normal breath sounds. No stridor. No wheezing, rhonchi or rales.  Abdominal:     General: Abdomen is flat. Bowel sounds are normal. There is no distension.     Palpations: Abdomen is soft. There is no hepatomegaly, splenomegaly or mass.     Tenderness: There is no abdominal tenderness. There is no guarding or rebound.     Hernia: No hernia is present.   Musculoskeletal:        General: Normal range of motion.     Cervical back: Normal range of motion and neck supple. No rigidity. No muscular tenderness.  Lymphadenopathy:     Cervical: No cervical adenopathy.  Skin:    General: Skin is warm and dry.     Capillary Refill: Capillary refill takes less than 2 seconds.  Neurological:     General: No focal deficit present.     Mental Status: She is alert and oriented to person, place, and time. Mental status is  at baseline.  Psychiatric:        Mood and Affect: Mood normal.        Behavior: Behavior normal.        Thought Content: Thought content normal.        Judgment: Judgment normal.    Lab Results  Component Value Date   TSH 0.953 03/01/2020   Lab Results  Component Value Date   WBC 4.5 03/01/2020   HGB 12.8 03/01/2020   HCT 38.2 03/01/2020   MCV 94 03/01/2020   PLT 242 03/01/2020   Lab Results  Component Value Date   NA 139 03/01/2020   K 4.4 03/01/2020   CO2 25 03/01/2020   GLUCOSE 89 03/01/2020   BUN 14 03/01/2020   CREATININE 0.67 03/01/2020   BILITOT 0.5 03/01/2020   ALKPHOS 38 (L) 03/01/2020   AST 20 03/01/2020   ALT 18 03/01/2020   PROT 7.4 03/01/2020   ALBUMIN 4.6 03/01/2020   CALCIUM 9.4 03/01/2020   No results found for: CHOL No results found for: HDL No results found for: LDLCALC No results found for: TRIG No results found for: CHOLHDL No results found for: HGBA1C

## 2022-01-25 ENCOUNTER — Other Ambulatory Visit: Payer: Self-pay | Admitting: Family Medicine

## 2022-01-25 DIAGNOSIS — A749 Chlamydial infection, unspecified: Secondary | ICD-10-CM

## 2022-01-25 LAB — CBC WITH DIFFERENTIAL/PLATELET
Basophils Absolute: 0 10*3/uL (ref 0.0–0.2)
Basos: 1 %
EOS (ABSOLUTE): 0.2 10*3/uL (ref 0.0–0.4)
Eos: 3 %
Hematocrit: 39 % (ref 34.0–46.6)
Hemoglobin: 13.3 g/dL (ref 11.1–15.9)
Immature Grans (Abs): 0 10*3/uL (ref 0.0–0.1)
Immature Granulocytes: 0 %
Lymphocytes Absolute: 1.8 10*3/uL (ref 0.7–3.1)
Lymphs: 33 %
MCH: 31.9 pg (ref 26.6–33.0)
MCHC: 34.1 g/dL (ref 31.5–35.7)
MCV: 94 fL (ref 79–97)
Monocytes Absolute: 0.3 10*3/uL (ref 0.1–0.9)
Monocytes: 5 %
Neutrophils Absolute: 3.3 10*3/uL (ref 1.4–7.0)
Neutrophils: 58 %
Platelets: 269 10*3/uL (ref 150–450)
RBC: 4.17 x10E6/uL (ref 3.77–5.28)
RDW: 12.2 % (ref 11.7–15.4)
WBC: 5.5 10*3/uL (ref 3.4–10.8)

## 2022-01-25 LAB — CMP14+EGFR
ALT: 17 IU/L (ref 0–32)
AST: 18 IU/L (ref 0–40)
Albumin/Globulin Ratio: 1.6 (ref 1.2–2.2)
Albumin: 4.7 g/dL (ref 3.8–4.8)
Alkaline Phosphatase: 31 IU/L — ABNORMAL LOW (ref 44–121)
BUN/Creatinine Ratio: 16 (ref 9–23)
BUN: 11 mg/dL (ref 6–24)
Bilirubin Total: 0.8 mg/dL (ref 0.0–1.2)
CO2: 24 mmol/L (ref 20–29)
Calcium: 9.3 mg/dL (ref 8.7–10.2)
Chloride: 102 mmol/L (ref 96–106)
Creatinine, Ser: 0.67 mg/dL (ref 0.57–1.00)
Globulin, Total: 2.9 g/dL (ref 1.5–4.5)
Glucose: 93 mg/dL (ref 70–99)
Potassium: 3.9 mmol/L (ref 3.5–5.2)
Sodium: 139 mmol/L (ref 134–144)
Total Protein: 7.6 g/dL (ref 6.0–8.5)
eGFR: 113 mL/min/{1.73_m2} (ref >59)

## 2022-01-25 LAB — CYTOLOGY - PAP
Adequacy: ABSENT
Chlamydia: POSITIVE — AB
Comment: NEGATIVE
Comment: NEGATIVE
Comment: NEGATIVE
Comment: NORMAL
Diagnosis: NEGATIVE
High risk HPV: NEGATIVE
Neisseria Gonorrhea: NEGATIVE
Trichomonas: NEGATIVE

## 2022-01-25 LAB — HCV AB W REFLEX TO QUANT PCR: HCV Ab: NONREACTIVE

## 2022-01-25 LAB — LIPID PANEL
Chol/HDL Ratio: 3 ratio (ref 0.0–4.4)
Cholesterol, Total: 203 mg/dL — ABNORMAL HIGH (ref 100–199)
HDL: 67 mg/dL (ref >39)
LDL Chol Calc (NIH): 120 mg/dL — ABNORMAL HIGH (ref 0–99)
Triglycerides: 91 mg/dL (ref 0–149)
VLDL Cholesterol Cal: 16 mg/dL (ref 5–40)

## 2022-01-25 LAB — HCV INTERPRETATION

## 2022-01-25 LAB — PROLACTIN: Prolactin: 10.3 ng/mL (ref 4.8–23.3)

## 2022-01-25 LAB — FSH/LH
FSH: 1.2 m[IU]/mL
LH: 1.4 m[IU]/mL

## 2022-01-25 LAB — TSH: TSH: 0.975 u[IU]/mL (ref 0.450–4.500)

## 2022-01-25 LAB — HIV ANTIBODY (ROUTINE TESTING W REFLEX): HIV Screen 4th Generation wRfx: NONREACTIVE

## 2022-01-25 LAB — T4, FREE: Free T4: 1.51 ng/dL (ref 0.82–1.77)

## 2022-01-25 MED ORDER — DOXYCYCLINE HYCLATE 100 MG PO TABS: 100.0000 mg | ORAL_TABLET | Freq: Two times a day (BID) | ORAL | 0 refills | Status: AC

## 2022-02-07 ENCOUNTER — Ambulatory Visit: Payer: Medicaid Other | Admitting: Family Medicine

## 2022-02-07 ENCOUNTER — Ambulatory Visit
Admission: RE | Admit: 2022-02-07 | Discharge: 2022-02-07 | Disposition: A | Discharge status: Home / Self Care | Payer: Medicaid Other | Source: Ambulatory Visit | Attending: Family Medicine | Admitting: Family Medicine

## 2022-02-07 ENCOUNTER — Encounter: Payer: Self-pay | Admitting: Family Medicine

## 2022-02-07 VITALS — BP 114/73 | HR 80 | Temp 98.4°F | Ht 65.35 in | Wt 125.2 lb

## 2022-02-07 DIAGNOSIS — Z1231 Encounter for screening mammogram for malignant neoplasm of breast: Secondary | ICD-10-CM

## 2022-02-07 DIAGNOSIS — A749 Chlamydial infection, unspecified: Secondary | ICD-10-CM | POA: Diagnosis not present

## 2022-02-07 NOTE — Progress Notes (Signed)
   Assessment & Plan:  1. Chlamydia Test of cure today.  Encouraged patient to discuss testing and treatment further with her husband.  Reassurance provided regarding her children.  Education provided on chlamydia.  The health department was previously notified and should be following up with her. - NuSwab Vaginitis Plus (VG+)   Follow up plan: Return if symptoms worsen or fail to improve.  [name], MSN, APRN, FNP-C [facility]  Subjective:   Patient ID: Ruth Arellano, female    DOB: [birth year], 41 y.o.   MRN: [id number]  HPI: Ruth Arellano is a 41 y.o. female presenting on 02/07/2022 for SEXUALLY TRANSMITTED DISEASE (Patient has questions about recent dx. )  Patient was diagnosed with chlamydia on 01/25/2022. She has completed Doxycycline 100 mg BID x7 days.  She reports her husband is refusing to get tested.  She just wants to make sure her children are safe since he is obviously not being treated.   ROS: Negative unless specifically indicated above in HPI.   Relevant past medical history reviewed and updated as indicated.   Allergies and medications reviewed and updated.   Current Outpatient Medications:    ibuprofen (ADVIL) 600 MG tablet, Take 1 tablet (600 mg total) by mouth every 8 (eight) hours as needed., Disp: 60 tablet, Rfl: 2   methocarbamol (ROBAXIN) 500 MG tablet, Take 1 tablet (500 mg total) by mouth 4 (four) times daily., Disp: 60 tablet, Rfl: 1   Norethindrone-Ethinyl Estradiol-Fe Biphas (LO LOESTRIN FE) 1 MG-10 MCG / 10 MCG tablet, Take 1 tablet by mouth daily., Disp: 28 tablet, Rfl: 11  No Known Allergies  Objective:   Ht 5' 5.35" (1.66 m)   BMI 21.07 kg/m    Physical Exam Vitals reviewed.  Constitutional:      General: She is not in acute distress.    Appearance: Normal appearance. She is not ill-appearing, toxic-appearing or diaphoretic.  HENT:     Head: Normocephalic and atraumatic.  Eyes:     General: No scleral icterus.        Right eye: No discharge.        Left eye: No discharge.     Conjunctiva/sclera: Conjunctivae normal.  Cardiovascular:     Rate and Rhythm: Normal rate.  Pulmonary:     Effort: Pulmonary effort is normal. No respiratory distress.  Musculoskeletal:        General: Normal range of motion.     Cervical back: Normal range of motion.  Skin:    General: Skin is warm and dry.     Capillary Refill: Capillary refill takes less than 2 seconds.  Neurological:     General: No focal deficit present.     Mental Status: She is alert and oriented to person, place, and time. Mental status is at baseline.  Psychiatric:        Mood and Affect: Mood normal.        Behavior: Behavior normal.        Thought Content: Thought content normal.        Judgment: Judgment normal.

## 2022-02-09 ENCOUNTER — Other Ambulatory Visit: Payer: Self-pay | Admitting: Family Medicine

## 2022-02-09 DIAGNOSIS — R928 Other abnormal and inconclusive findings on diagnostic imaging of breast: Secondary | ICD-10-CM

## 2022-02-09 LAB — NUSWAB VAGINITIS PLUS (VG+)
Atopobium vaginae: HIGH Score — AB
Candida albicans, NAA: NEGATIVE
Candida glabrata, NAA: NEGATIVE
Chlamydia trachomatis, NAA: NEGATIVE
Neisseria gonorrhoeae, NAA: NEGATIVE
Trich vag by NAA: NEGATIVE

## 2022-02-12 ENCOUNTER — Other Ambulatory Visit: Payer: Self-pay | Admitting: Family Medicine

## 2022-02-12 DIAGNOSIS — B9689 Other specified bacterial agents as the cause of diseases classified elsewhere: Secondary | ICD-10-CM

## 2022-02-12 MED ORDER — METRONIDAZOLE 500 MG PO TABS: 500.0000 mg | ORAL_TABLET | Freq: Two times a day (BID) | ORAL | 0 refills | Status: DC

## 2022-02-20 ENCOUNTER — Ambulatory Visit
Admission: RE | Admit: 2022-02-20 | Discharge: 2022-02-20 | Disposition: A | Discharge status: Home / Self Care | Payer: Medicaid Other | Source: Ambulatory Visit | Attending: Family Medicine | Admitting: Family Medicine

## 2022-02-20 ENCOUNTER — Ambulatory Visit: Payer: Medicaid Other

## 2022-02-20 DIAGNOSIS — R928 Other abnormal and inconclusive findings on diagnostic imaging of breast: Secondary | ICD-10-CM

## 2022-02-26 ENCOUNTER — Telehealth: Payer: Self-pay | Admitting: Family Medicine

## 2022-05-03 DIAGNOSIS — H903 Sensorineural hearing loss, bilateral: Secondary | ICD-10-CM | POA: Insufficient documentation

## 2022-05-04 ENCOUNTER — Other Ambulatory Visit: Payer: Self-pay | Admitting: Physician Assistant

## 2022-05-04 DIAGNOSIS — H903 Sensorineural hearing loss, bilateral: Secondary | ICD-10-CM

## 2022-05-25 ENCOUNTER — Ambulatory Visit
Admission: RE | Admit: 2022-05-25 | Discharge: 2022-05-25 | Disposition: A | Discharge status: Home / Self Care | Payer: Medicaid Other | Source: Ambulatory Visit | Attending: Physician Assistant | Admitting: Physician Assistant

## 2022-05-25 DIAGNOSIS — H903 Sensorineural hearing loss, bilateral: Secondary | ICD-10-CM

## 2022-05-25 MED ORDER — GADOBENATE DIMEGLUMINE 529 MG/ML IV SOLN
11.0000 mL | Freq: Once | INTRAVENOUS | Status: AC | PRN
  Administered 2022-05-25: 11 mL via INTRAVENOUS

## 2023-01-02 ENCOUNTER — Other Ambulatory Visit: Payer: Self-pay | Admitting: Family Medicine

## 2023-01-02 DIAGNOSIS — Z0000 Encounter for general adult medical examination without abnormal findings: Secondary | ICD-10-CM

## 2023-02-11 ENCOUNTER — Ambulatory Visit
Admission: RE | Admit: 2023-02-11 | Discharge: 2023-02-11 | Disposition: A | Discharge status: Home / Self Care | Payer: Medicaid Other | Source: Ambulatory Visit | Attending: Family Medicine | Admitting: Family Medicine

## 2023-02-11 DIAGNOSIS — Z0000 Encounter for general adult medical examination without abnormal findings: Secondary | ICD-10-CM

## 2023-02-12 ENCOUNTER — Ambulatory Visit: Payer: Medicaid Other

## 2023-02-13 ENCOUNTER — Ambulatory Visit (INDEPENDENT_AMBULATORY_CARE_PROVIDER_SITE_OTHER): Payer: Medicaid Other | Admitting: Nurse Practitioner

## 2023-02-13 ENCOUNTER — Encounter: Payer: Self-pay | Admitting: Nurse Practitioner

## 2023-02-13 VITALS — BP 104/64 | HR 60 | Temp 97.9°F | Ht 65.0 in | Wt 128.8 lb

## 2023-02-13 DIAGNOSIS — Z113 Encounter for screening for infections with a predominantly sexual mode of transmission: Secondary | ICD-10-CM | POA: Diagnosis not present

## 2023-02-13 DIAGNOSIS — Z0000 Encounter for general adult medical examination without abnormal findings: Secondary | ICD-10-CM | POA: Diagnosis not present

## 2023-02-13 DIAGNOSIS — Z7689 Persons encountering health services in other specified circumstances: Secondary | ICD-10-CM

## 2023-02-13 LAB — WET PREP FOR TRICH, YEAST, CLUE
Clue Cell Exam: NEGATIVE
Trichomonas Exam: NEGATIVE
Yeast Exam: NEGATIVE

## 2023-02-13 LAB — PREGNANCY, URINE: Preg Test, Ur: NEGATIVE

## 2023-02-13 NOTE — Progress Notes (Signed)
Complete physical exam  Patient: Ruth Arellano   DOB: 1981/04/17   42 y.o. Female  MRN: [id number]  Subjective:    Chief Complaint  Patient presents with   Establish Care   Annual Exam   Ruth Arellano is a 42 y.o. female who presents today for a complete physical exam. Ruth Arellano interpreter in the room with the her  for this for this encounter She reports dealing with headache "in the past it was before or during her cycle and has been taking OTC vit B and Zinc and it is better". Ibuprofen makes is  feel better. Since started on Zinc she went to have them only prior to her cycle. She reports dealing tinnitus and hearing loss on the right side " it is associated with loud noises from her childhood in [place]. She was told that will need hearing aid, but wants to get she get older" She was dx with BV last yr and reports that her husband never got treated, now has a rash on his genital.  She denies any known vaginal discharge, itchiness dysuria, we will do full STD panel on her left.  She reports consuming a general diet. The patient has a physically strenuous job, but has no regular exercise apart from work.  She generally feels well. She reports sleeping well. She does have additional problems to discuss today.   She see GYN for pap and mammogram LMP 01/29/2023, HCG negative  Last PAP:feb 2024 Last Mammogram: 02/11/2023 Most recent fall risk assessment:No recent falls    12/12/2021    3:16 PM  Fall Risk   Falls in the past year? 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 No pertinent past medical history. Past Surgical History:  Procedure Laterality Date   APPENDECTOMY     CESAREAN SECTION     2    ECTOPIC PREGNANCY SURGERY     Social History   Tobacco Use   Smoking status: Never   Smokeless tobacco: Never  Vaping Use   Vaping Use: Never used  Substance Use Topics   Alcohol use: Never   Drug use: Never   Social History   Socioeconomic History   Marital status: Married    Spouse name: Not on file   Number of children: 2   Years of education: Not on file   Highest education level: Not on file  Occupational History   Occupation: Conservation officer, nature  Tobacco Use   Smoking status: Never   Smokeless tobacco: Never  Vaping Use   Vaping Use: Never used  Substance and Sexual Activity   Alcohol use: Never   Drug use: Never   Sexual activity: Not on file  Other Topics Concern   Not on file  Social History Narrative   Not on file   Social Determinants of Health   Financial Resource Strain: Not on file  Food Insecurity: No Food Insecurity (02/13/2023)   Hunger Vital Sign    Worried About Running Out of Food in the Last Year: Never true    Ran Out of Food in the Last Year: Never true  Transportation Needs: No Transportation Needs (02/13/2023)   PRAPARE - Administrator, Civil Service (Medical): No    Lack of Transportation (Non-Medical): No  Physical Activity: Not on file  Stress: Not on file  Social Connections: Not on file  Intimate Partner Violence: Not At Risk (02/13/2023)   Humiliation, Afraid, Rape, and Kick questionnaire    Fear of Current or Ex-Partner: No    Emotionally Abused: No    Physically Abused: No    Sexually Abused: No   Family Status  Relation Name  Status   Mother  Alive   Father  Deceased   Sister  Alive   Sister  Alive   Sister  Alive   Sister  Alive   Daughter  Alive   Brother  Alive   Son  Alive   Neg Hx  (Not Specified)    Patient Care Team: Evern Bio, Dois Davenport, NP as PCP - General (Nurse Practitioner)   Outpatient Medications Prior to Visit  Medication Sig   ibuprofen (ADVIL) 600 MG tablet Take 1 tablet (600 mg total) by mouth every 8 (eight) hours as needed. (Patient not taking: Reported on 02/13/2023)   methocarbamol (ROBAXIN) 500 MG tablet Take 1 tablet (500 mg total) by mouth 4 (four) times daily. (Patient not taking: Reported on 02/13/2023)   metroNIDAZOLE (FLAGYL) 500 MG tablet Take 1 tablet (500 mg total) by mouth 2 (two) times daily. (Patient not taking: Reported on 02/13/2023)   Norethindrone-Ethinyl Estradiol-Fe Biphas (LO LOESTRIN FE) 1 MG-10 MCG / 10 MCG tablet Take 1 tablet by mouth daily. (Patient not taking: Reported on 02/13/2023)   No facility-administered medications prior to visit.    ROS Negative unless indicated in HPI    Objective:     BP 104/64   Pulse 60   Temp 97.9 F (36.6 C) (Temporal)   Ht 5\' 5"  (1.651 m)   Wt 128 lb 12.8 oz (58.4 kg)   SpO2 100%   BMI 21.43 kg/m  BP Readings from Last 3 Encounters:  02/13/23 104/64  02/07/22 114/73  01/24/22 105/68   Wt Readings from Last 3 Encounters:  02/13/23 128 lb 12.8 oz (58.4 kg)  02/07/22 125 lb 3.2 oz (56.8 kg)  01/24/22 128 lb (58.1 kg)      Physical Exam Vitals and nursing note reviewed.  Constitutional:      General: She is not in acute distress.    Appearance: Normal appearance. She is not ill-appearing.  HENT:     Head: Normocephalic and atraumatic.  Eyes:     General: No scleral icterus.    Extraocular Movements: Extraocular movements intact.     Conjunctiva/sclera: Conjunctivae normal.     Pupils: Pupils are equal, round, and reactive to light.  Cardiovascular:     Rate and Rhythm: Normal rate.     Heart  sounds: Normal heart sounds.  Pulmonary:     Effort: Pulmonary effort is normal.     Breath sounds: Normal breath sounds. No wheezing or rhonchi.  Abdominal:     General: Bowel sounds are normal.     Tenderness: There is no  abdominal tenderness.     Hernia: No hernia is present.  Musculoskeletal:        General: Normal range of motion.     Cervical back: Normal range of motion and neck supple.     Right lower leg: No edema.     Left lower leg: No edema.  Skin:    General: Skin is warm and dry.     Findings: No rash.  Neurological:     General: No focal deficit present.     Mental Status: She is alert and oriented to person, place, and time. Mental status is at baseline.  Psychiatric:        Mood and Affect: Mood normal.        Behavior: Behavior normal.        Thought Content: Thought content normal.        Judgment: Judgment normal.      Results for orders placed or performed in visit on 02/13/23  WET PREP FOR TRICH, YEAST, CLUE   Specimen: Vaginal Swab  Result Value Ref Range   Trichomonas Exam Negative Negative   Yeast Exam Negative Negative   Clue Cell Exam Negative Negative  Pregnancy, urine  Result Value Ref Range   Preg Test, Ur Negative Negative   Last CBC Lab Results  Component Value Date   WBC 5.5 01/24/2022   HGB 13.3 01/24/2022   HCT 39.0 01/24/2022   MCV 94 01/24/2022   MCH 31.9 01/24/2022   RDW 12.2 01/24/2022   PLT 269 01/24/2022   Last metabolic panel Lab Results  Component Value Date   GLUCOSE 93 01/24/2022   NA 139 01/24/2022   K 3.9 01/24/2022   CL 102 01/24/2022   CO2 24 01/24/2022   BUN 11 01/24/2022   CREATININE 0.67 01/24/2022   EGFR 113 01/24/2022   CALCIUM 9.3 01/24/2022   PROT 7.6 01/24/2022   ALBUMIN 4.7 01/24/2022   LABGLOB 2.9 01/24/2022   AGRATIO 1.6 01/24/2022   BILITOT 0.8 01/24/2022   ALKPHOS 31 (L) 01/24/2022   AST 18 01/24/2022   ALT 17 01/24/2022   Last lipids Lab Results  Component Value Date   CHOL 203 (H)  01/24/2022   HDL 67 01/24/2022   LDLCALC 120 (H) 01/24/2022   TRIG 91 01/24/2022   CHOLHDL 3.0 01/24/2022   Last hemoglobin A1c No results found for: "HGBA1C" Last thyroid functions Lab Results  Component Value Date   TSH 0.975 01/24/2022        Assessment & Plan:    Routine Health Maintenance and Physical Exam  Discussed health benefits of physical activity, and encouraged her to engage in regular exercise appropriate for her age and condition.  Encounter to establish care -     CBC with Differential/Platelet -     CMP14+EGFR -     Lipid panel -     Thyroid Panel With TSH -     Pregnancy, urine  Annual physical exam -     CBC with Differential/Platelet -     CMP14+EGFR -     Lipid panel -     Thyroid Panel With TSH  Screening for STDs (sexually transmitted diseases) -     WET PREP FOR TRICH, YEAST, CLUE -     Ct Ng M genitalium NAA, Urine -     HepB+HepC+HIV Panel -     RPR -     HIV Antibody (routine testing w rflx)  STDs Screening Wet prep BV. GC, RPR, HIV,Hep-c,  Hep-B - Lab order: CBC, CMP, Lipid  Tinnitus/hearing loss Has been under the care of ENT, aware that she will ned hearing aid but wants to get when she get older. Return in about 1 year (around 02/13/2024) for physical.     Arrie Aran Santa Lighter, NP

## 2023-02-14 ENCOUNTER — Other Ambulatory Visit: Payer: Self-pay | Admitting: Nurse Practitioner

## 2023-02-14 DIAGNOSIS — E782 Mixed hyperlipidemia: Secondary | ICD-10-CM

## 2023-02-14 LAB — LIPID PANEL
Chol/HDL Ratio: 3.8 ratio (ref 0.0–4.4)
Cholesterol, Total: 309 mg/dL — ABNORMAL HIGH (ref 100–199)
HDL: 81 mg/dL (ref >39)
LDL Chol Calc (NIH): 213 mg/dL — ABNORMAL HIGH (ref 0–99)
Triglycerides: 94 mg/dL (ref 0–149)
VLDL Cholesterol Cal: 15 mg/dL (ref 5–40)

## 2023-02-14 LAB — CMP14+EGFR
ALT: 19 IU/L (ref 0–32)
AST: 17 IU/L (ref 0–40)
Albumin/Globulin Ratio: 1.6
Albumin: 4.7 g/dL (ref 3.9–4.9)
Alkaline Phosphatase: 32 IU/L — ABNORMAL LOW (ref 44–121)
BUN/Creatinine Ratio: 20 (ref 9–23)
BUN: 13 mg/dL (ref 6–24)
Bilirubin Total: 0.7 mg/dL (ref 0.0–1.2)
CO2: 19 mmol/L — ABNORMAL LOW (ref 20–29)
Calcium: 8.8 mg/dL (ref 8.7–10.2)
Chloride: 101 mmol/L (ref 96–106)
Creatinine, Ser: 0.66 mg/dL (ref 0.57–1.00)
Globulin, Total: 2.9 g/dL (ref 1.5–4.5)
Glucose: 87 mg/dL (ref 70–99)
Potassium: 4 mmol/L (ref 3.5–5.2)
Sodium: 137 mmol/L (ref 134–144)
Total Protein: 7.6 g/dL (ref 6.0–8.5)
eGFR: 113 mL/min/{1.73_m2} (ref >59)

## 2023-02-14 LAB — THYROID PANEL WITH TSH
Free Thyroxine Index: 2.1 (ref 1.2–4.9)
T3 Uptake Ratio: 26 % (ref 24–39)
T4, Total: 8.2 ug/dL (ref 4.5–12.0)
TSH: 0.797 u[IU]/mL (ref 0.450–4.500)

## 2023-02-14 LAB — CBC WITH DIFFERENTIAL/PLATELET
Basophils Absolute: 0 10*3/uL (ref 0.0–0.2)
Basos: 1 %
EOS (ABSOLUTE): 0.1 10*3/uL (ref 0.0–0.4)
Eos: 2 %
Hematocrit: 38.9 % (ref 34.0–46.6)
Hemoglobin: 12.6 g/dL (ref 11.1–15.9)
Immature Grans (Abs): 0 10*3/uL (ref 0.0–0.1)
Immature Granulocytes: 0 %
Lymphocytes Absolute: 1.5 10*3/uL (ref 0.7–3.1)
Lymphs: 43 %
MCH: 32 pg (ref 26.6–33.0)
MCHC: 32.4 g/dL (ref 31.5–35.7)
MCV: 99 fL — ABNORMAL HIGH (ref 79–97)
Monocytes Absolute: 0.2 10*3/uL (ref 0.1–0.9)
Monocytes: 6 %
Neutrophils Absolute: 1.7 10*3/uL (ref 1.4–7.0)
Neutrophils: 48 %
Platelets: 247 10*3/uL (ref 150–450)
RBC: 3.94 x10E6/uL (ref 3.77–5.28)
RDW: 12.5 % (ref 11.7–15.4)
WBC: 3.5 10*3/uL (ref 3.4–10.8)

## 2023-02-14 LAB — HEPB+HEPC+HIV PANEL
HIV Screen 4th Generation wRfx: NONREACTIVE
Hep B C IgM: NEGATIVE
Hep B Core Total Ab: POSITIVE — AB
Hep B E Ab: REACTIVE — AB
Hep B E Ag: NEGATIVE
Hep B Surface Ab, Qual: REACTIVE
Hep C Virus Ab: NONREACTIVE
Hepatitis B Surface Ag: NEGATIVE

## 2023-02-14 LAB — RPR: RPR Ser Ql: NONREACTIVE

## 2023-02-14 MED ORDER — ATORVASTATIN CALCIUM 10 MG PO TABS
10.0000 mg | ORAL_TABLET | Freq: Every day | ORAL | 11 refills | Status: AC
Start: 2023-02-14 — End: 2024-05-28

## 2023-02-14 NOTE — Progress Notes (Signed)
Based on client recent lab results she will starting Lipitor 10 mg 1-tab daily

## 2023-02-16 LAB — CT NG M GENITALIUM NAA, URINE
Chlamydia trachomatis, NAA: NEGATIVE
Mycoplasma genitalium NAA: NEGATIVE
Neisseria gonorrhoeae, NAA: NEGATIVE

## 2023-02-19 ENCOUNTER — Telehealth: Payer: Self-pay | Admitting: Nurse Practitioner

## 2023-02-19 NOTE — Telephone Encounter (Signed)
Used interpretor. Pt had questions about how long she needs to take cholesterol medication. Advised pt she needs to take medication and monitor her diet and exercise. When her cholesterol is rechecked in a few months, if it is better, she could discuss coming off of the medication with pcp, however if she is not able to maintain cholesterol levels with diet and exercise she will need to stay on cholesterol medication to prevent her risk for stroke and heart attack. Pt verbalized understanding and has no further questions at this time.

## 2023-04-24 ENCOUNTER — Ambulatory Visit: Payer: Medicaid Other | Admitting: Nurse Practitioner

## 2023-04-24 ENCOUNTER — Encounter: Payer: Self-pay | Admitting: Nurse Practitioner

## 2023-04-24 VITALS — BP 98/64 | HR 79 | Temp 98.3°F | Ht 65.0 in | Wt 132.6 lb

## 2023-04-24 DIAGNOSIS — M25562 Pain in left knee: Secondary | ICD-10-CM | POA: Diagnosis not present

## 2023-04-24 MED ORDER — MELOXICAM 7.5 MG PO TABS
7.5000 mg | ORAL_TABLET | Freq: Every day | ORAL | 0 refills | Status: AC | PRN
Start: 2023-04-24 — End: ?

## 2023-04-24 NOTE — Progress Notes (Signed)
Acute Office Visit  Subjective:     Patient ID: Hero Prem, female    DOB: 1980/11/28, 42 y.o.   MRN: [id number]  Chief Complaint  Patient presents with   Knee Pain    Left knee pain for over 10 days. Hurts when walking on stairs or squatting.    Due to language barrier, an interpreter was present during the history-taking and subsequent discussion (and for part of the physical exam) with this patient.  Talitha # [id number]  Knee Pain     Knee Pain: Patient presents with knee pain involving the  left knee. Onset of the symptoms was several weeks ago. Inciting event: none known. Current symptoms include  pain with squat . Pain is aggravated by going up and down stairs and squatting.  Patient has had no prior knee problems. Evaluation to date: none. Treatment to date: none.  Describes the pain as aching,  5/10  has not take any OTC Review of Systems  Constitutional:  Negative for chills and fever.  Cardiovascular:  Positive for leg swelling. Negative for chest pain.  Musculoskeletal:  Positive for joint pain. Negative for falls.       Left knee  Skin:  Negative for itching and rash.  Neurological:  Negative for dizziness and headaches.  Psychiatric/Behavioral:  Negative for depression.    Negative unless indicated in HPI    Objective:    BP 98/64   Pulse 79   Temp 98.3 F (36.8 C) (Temporal)   Ht 5\' 5"  (1.651 m)   Wt 132 lb 9.6 oz (60.1 kg)   SpO2 98%   BMI 22.07 kg/m  BP Readings from Last 3 Encounters:  04/24/23 98/64  02/13/23 104/64  02/07/22 114/73   Wt Readings from Last 3 Encounters:  04/24/23 132 lb 9.6 oz (60.1 kg)  02/13/23 128 lb 12.8 oz (58.4 kg)  02/07/22 125 lb 3.2 oz (56.8 kg)    Physical Exam Vitals and nursing note reviewed.  Constitutional:      Appearance: Normal appearance.  HENT:     Head: Normocephalic and atraumatic.  Eyes:     Extraocular Movements: Extraocular movements intact.     Conjunctiva/sclera: Conjunctivae normal.     Pupils:  Pupils are equal, round, and reactive to light.  Cardiovascular:     Rate and Rhythm: Normal rate and regular rhythm.  Pulmonary:     Effort: Pulmonary effort is normal.     Breath sounds: Normal breath sounds.  Musculoskeletal:     Cervical back: Normal range of motion and neck supple.     Right knee: Normal.     Left knee: Swelling present. No bony tenderness or crepitus. No tenderness. Normal pulse.  Skin:    General: Skin is warm and dry.  Neurological:     Mental Status: She is alert and oriented to person, place, and time. Mental status is at baseline.    No results found for any visits on 04/24/23.     Assessment & Plan:  Acute pain of left knee    Merri  is a 42 yr. old female seen today fro left knee pain, no acute distress No x-ray needed Mobic 7.5 mg PRN  Okay to use heat/ice 15 mins.  Wear knee sleeve while working for long hours  The above assessment and management plan was discussed with the patient. The patient verbalized understanding of and has agreed to the management plan. Patient is aware to call the clinic if they develop any  new symptoms or if symptoms persist or worsen. Patient is aware when to return to the clinic for a follow-up visit. Patient educated on when it is appropriate to go to the emergency department.  Return in about 6 months (around 10/25/2023) for Chronic disease.  Arrie Aran Santa Lighter, DNP [facility] Medicine [address] [place], [place] [address] [phone]

## 2023-09-23 ENCOUNTER — Ambulatory Visit: Payer: Self-pay | Admitting: Nurse Practitioner

## 2023-09-23 NOTE — Telephone Encounter (Signed)
Copied from CRM [id number]. Topic: Clinical - Pink Word Triage >> Sep 23, 2023  2:09 PM Alvino Blood C wrote: Reason for Triage: Patient maybe having a possible allergic reaction. She says her back broke out about 2 weeks ago. She doesn't believe it an animal bite. Patient would like a call back at [phone]   Chief Complaint: Itchiness Symptoms: Itchiness to back after shower Frequency: Happens after every shower Pertinent Negatives: Patient denies rash or bumps to the area Disposition: [] ED /[] Urgent Care (no appt availability in office) / [] Appointment(In office/virtual)/ []  [facility]/ [] Home Care/ [] Refused Recommended Disposition /[] [facility]/ []  Follow-up with PCP Additional Notes: Called patient with interpreter [phone]. Patient reports that for the last 2 weeks she has been experiencing itchiness to her back after taking a shower. Patient states that there is no noticeable rash or hives. She states she has been applying 48 hour moisturizing lotion which has improved the itchiness "but I have to apply it 5-6 times at night and in the morning," She states she has also been using a cortisone cream but it does not work as well as the 48 hour moisturizing lotion. Patient has an appointment tomorrow which she will keep to discuss this with her PCP.       Reason for Disposition  [1] Cause unknown AND [2] present > 7 days  Answer Assessment - Initial Assessment Questions 1. DESCRIPTION: "Describe the itching you are having." "Where is it located?"     Back 2. SEVERITY: "How bad is it?"    - MILD: Doesn't interfere with normal activities.   - MODERATE-SEVERE: Interferes with work, school, sleep, or other activities.      Mild to moderate  3. SCRATCHING: "Are there any scratch marks? Bleeding?"     No 4. ONSET: "When did the itching begin?"      2 weeks 5. CAUSE: "What do you think is causing the itching?"      Unsure 6. OTHER SYMPTOMS: "Do you have any other  symptoms?"      No  Protocols used: Itching - Localized-A-AH

## 2023-09-24 ENCOUNTER — Ambulatory Visit: Payer: Medicaid Other | Admitting: Nurse Practitioner

## 2023-09-24 ENCOUNTER — Encounter: Payer: Self-pay | Admitting: Nurse Practitioner

## 2023-09-24 VITALS — BP 96/55 | HR 71 | Temp 98.4°F | Ht 65.0 in | Wt 126.8 lb

## 2023-09-24 DIAGNOSIS — R21 Rash and other nonspecific skin eruption: Secondary | ICD-10-CM | POA: Diagnosis not present

## 2023-09-24 DIAGNOSIS — B350 Tinea barbae and tinea capitis: Secondary | ICD-10-CM | POA: Insufficient documentation

## 2023-09-24 MED ORDER — KETOCONAZOLE 2 % EX SHAM
1.0000 | MEDICATED_SHAMPOO | CUTANEOUS | 2 refills | Status: DC
Start: 2023-09-26 — End: 2024-03-25

## 2023-09-24 MED ORDER — HYDROCORTISONE 1 % EX LOTN
1.0000 | TOPICAL_LOTION | Freq: Two times a day (BID) | CUTANEOUS | 0 refills | Status: DC
Start: 2023-09-24 — End: 2023-10-28

## 2023-09-24 NOTE — Progress Notes (Unsigned)
Acute Office Visit  Subjective:     Patient ID: Ruth Arellano, female    DOB: 1981/02/16, 43 y.o.   MRN: [id number]  Chief Complaint  Patient presents with   itchy skin    Back is itching, no rash, during day is okay after shower it gets very itchy for 10 days. Head is itchy as well.      [employer] video interpreter Ruth Arellano, [place] [phone] used for chine translation of this visit  Rash on back and head for a few months "Switch shampoo but not working" HPI Ruth Arellano 43 year old female present September 24, 2023 for an acute visit concern for rash on the back. Rash: Patient complains of rash involving the {body location:12896}. Rash started {numbers; 0-10:33138} {time units w/ wo plural:11} ago. Appearance of rash at onset: {rash appear:16498}. Rash {has/not:18111} changed over time Initial distribution: {body location:12896}.  Discomfort associated with rash: {rash dc:16501}.  Associated symptoms: {rash assoc:16502}. Denies: {rash assoc:16502}. Patient {has/not:18111} had previous evaluation of rash. Patient {has/not:18111} had previous treatment.  Response to treatment: ***. Patient {has/not:18111} had contacts with similar rash. Patient {has/not:18111} identified precipitant. Patient {has/not:18111} had new exposures (soaps, lotions, laundry detergents, foods, medications, plants, insects or animals.)  ROS Negative unless indicated in HPI    Objective:    BP (!) 96/55   Pulse 71   Temp 98.4 F (36.9 C) (Temporal)   Ht 5\' 5"  (1.651 m)   Wt 126 lb 12.8 oz (57.5 kg)   SpO2 99%   BMI 21.10 kg/m  BP Readings from Last 3 Encounters:  09/24/23 (!) 96/55  04/24/23 98/64  02/13/23 104/64   Wt Readings from Last 3 Encounters:  09/24/23 126 lb 12.8 oz (57.5 kg)  04/24/23 132 lb 9.6 oz (60.1 kg)  02/13/23 128 lb 12.8 oz (58.4 kg)      Physical Exam Vitals and nursing note reviewed.  Constitutional:      Appearance: Normal appearance.  HENT:     Head: Normocephalic and atraumatic.      Nose: Nose normal.     Mouth/Throat:     Mouth: Mucous membranes are moist.  Eyes:     General: No scleral icterus.    Extraocular Movements: Extraocular movements intact.     Conjunctiva/sclera: Conjunctivae normal.     Pupils: Pupils are equal, round, and reactive to light.  Cardiovascular:     Rate and Rhythm: Normal rate and regular rhythm.  Pulmonary:     Effort: Pulmonary effort is normal.     Breath sounds: Normal breath sounds.  Musculoskeletal:        General: Normal range of motion.     Right lower leg: No edema.     Left lower leg: No edema.  Skin:    General: Skin is warm and dry.     Findings: Rash present.     Comments: Back and around hairline  Neurological:     Mental Status: She is alert and oriented to person, place, and time. Mental status is at baseline.  Psychiatric:        Mood and Affect: Mood normal.        Behavior: Behavior normal.        Thought Content: Thought content normal.        Judgment: Judgment normal.     No results found for any visits on 09/24/23.      Assessment & Plan:  Tinea capitis -     Ketoconazole; Apply 1 Application topically 2 (two)  times a week.  Dispense: 120 mL; Refill: 2  Rash of back -     Hydrocortisone; Apply 1 Application topically 2 (two) times daily.  Dispense: 118 mL; Refill: 0   Ruth Arellano is 43 yrs old [place] female seen today for tinea capitis, no acute distress  Will use Azole Shampoo 2% Twice Daily   The above assessment and management plan was discussed with the patient. The patient verbalized understanding of and has agreed to the management plan. Patient is aware to call the clinic if they develop any new symptoms or if symptoms persist or worsen. Patient is aware when to return to the clinic for a follow-up visit. Patient educated on when it is appropriate to go to the emergency department.  Return if symptoms worsen or fail to improve.  Arrie Aran Santa Lighter, [place] [facility]  Medicine [address] [place], [place] [address] [phone]  Note: This document was prepared by [employer] voice dictation technology and any errors that results from this process are unintentional.

## 2023-10-24 NOTE — Progress Notes (Unsigned)
 Established Patient Office Visit  Subjective  Patient ID: Ruth Arellano, female    DOB: [birth year]  Age: 43 y.o. MRN: [id number]  Chief Complaint  Patient presents with   Medical Management of Chronic Issues    6 month  Stratus video interpreter [place], [place] [id number] used for Mandarin translation of this visit  HPI [facility] [employer] Office Visit from 10/28/2023 in [facility]  PHQ-9 Total Score 0          10/28/2023   12:54 PM 01/24/2022    9:15 AM 12/12/2021    3:16 PM 05/30/2021   10:22 AM  GAD 7 : Generalized Anxiety Score  Nervous, Anxious, on Edge 0 0 0 0  Control/stop worrying 0 0 0 0  Worry too much - different things 0 0 0 0  Trouble relaxing 0 0 0 0  Restless 0 0 0 0  Easily annoyed or irritable 0 0 0 0  Afraid - awful might happen 0 0 0 0  Total GAD 7 Score 0 0 0 0  Anxiety Difficulty Not difficult at all Not difficult at all Not difficult at all      Patient Active Problem List   Diagnosis Date Noted   Primary oligomenorrhea 10/28/2023   Rash of back 10/28/2023   Tinea capitis 09/24/2023   Acute pain of left knee 04/24/2023   Annual physical exam 02/13/2023   Encounter to establish care 02/13/2023   Screening for STDs (sexually transmitted diseases) 02/13/2023   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 No pertinent past medical history. Past Surgical History:  Procedure Laterality Date   APPENDECTOMY     CESAREAN SECTION     2    ECTOPIC PREGNANCY SURGERY     Social History   Tobacco Use   Smoking status: Never   Smokeless tobacco: Never  Vaping Use   Vaping status: Never Used  Substance Use Topics   Alcohol use: Never   Drug use: Never   Social History   Socioeconomic History    Marital status: Married    Spouse name: Not on file   Number of children: 2   Years of education: Not on file   Highest education level: Not on file  Occupational History   Occupation: Conservation officer, nature  Tobacco Use   Smoking status: Never   Smokeless tobacco: Never  Vaping Use   Vaping status: Never Used  Substance and Sexual Activity   Alcohol use: Never   Drug use: Never   Sexual activity: Not on file  Other Topics Concern   Not on file  Social History Narrative   Not on file   Social Drivers of Health   Financial Resource Strain: Not on file  Food Insecurity: No Food Insecurity (02/13/2023)   Hunger Vital Sign    Worried About Running Out of Food in the Last Year: Never true    Ran Out of Food in the Last Year: Never true  Transportation Needs: No Transportation Needs (02/13/2023)   PRAPARE - Administrator, Civil Service (Medical): No    Lack of Transportation (Non-Medical): No  Physical Activity: Not on file  Stress: Not on file  Social Connections: Not on file  Intimate Partner Violence: Not At Risk (02/13/2023)  Humiliation, Afraid, Rape, and Kick questionnaire    Fear of Current or Ex-Partner: No    Emotionally Abused: No    Physically Abused: No    Sexually Abused: No   Family Status  Relation Name Status   Mother  Alive   Father  Deceased   Sister  Alive   Sister  Alive   Sister  Alive   Sister  Alive   Daughter  Alive   Brother  Alive   Son  Alive   Neg Hx  (Not Specified)  No partnership data on file   Family History  Problem Relation Age of Onset   Breast cancer Neg Hx    No Known Allergies    Review of Systems  Constitutional:  Negative for chills and fever.  HENT:  Negative for congestion, hearing loss and sore throat.   Respiratory:  Negative for cough, shortness of breath and wheezing.   Cardiovascular:  Negative for chest pain and leg swelling.  Skin:  Positive for rash. Negative for itching.       neck   Negative unless  indicated in HPI   Objective:     BP 122/83   Pulse 83   Temp 97.8 F (36.6 C) (Temporal)   Ht 5\' 5"  (1.651 m)   Wt 126 lb 9.6 oz (57.4 kg)   SpO2 98%   BMI 21.07 kg/m  BP Readings from Last 3 Encounters:  10/28/23 122/83  09/24/23 (!) 96/55  04/24/23 98/64   Wt Readings from Last 3 Encounters:  10/28/23 126 lb 9.6 oz (57.4 kg)  09/24/23 126 lb 12.8 oz (57.5 kg)  04/24/23 132 lb 9.6 oz (60.1 kg)      Physical Exam   No results found for any visits on 10/28/23.  Last CBC Lab Results  Component Value Date   WBC 3.5 02/13/2023   HGB 12.6 02/13/2023   HCT 38.9 02/13/2023   MCV 99 (H) 02/13/2023   MCH 32.0 02/13/2023   RDW 12.5 02/13/2023   PLT 247 02/13/2023   Last metabolic panel Lab Results  Component Value Date   GLUCOSE 87 02/13/2023   NA 137 02/13/2023   K 4.0 02/13/2023   CL 101 02/13/2023   CO2 19 (L) 02/13/2023   BUN 13 02/13/2023   CREATININE 0.66 02/13/2023   EGFR 113 02/13/2023   CALCIUM 8.8 02/13/2023   PROT 7.6 02/13/2023   ALBUMIN 4.7 02/13/2023   LABGLOB 2.9 02/13/2023   AGRATIO 1.6 02/13/2023   BILITOT 0.7 02/13/2023   ALKPHOS 32 (L) 02/13/2023   AST 17 02/13/2023   ALT 19 02/13/2023   Last lipids Lab Results  Component Value Date   CHOL 309 (H) 02/13/2023   HDL 81 02/13/2023   LDLCALC 213 (H) 02/13/2023   TRIG 94 02/13/2023   CHOLHDL 3.8 02/13/2023   Last hemoglobin A1c No results found for: "HGBA1C" Last thyroid functions Lab Results  Component Value Date   TSH 0.797 02/13/2023   T4TOTAL 8.2 02/13/2023        Assessment & Plan:  Primary oligomenorrhea  Rash of back -     Hydrocortisone; Apply 1 Application topically 2 (two) times daily.  Dispense: 118 mL; Refill: 0   Continue healthy lifestyle choices, including diet (rich in fruits, vegetables, and lean proteins, and low in salt and simple carbohydrates) and exercise (at least 30 minutes of moderate physical activity daily).     The above assessment and  management plan was discussed with the patient.  The patient verbalized understanding of and has agreed to the management plan. Patient is aware to call the clinic if they develop any new symptoms or if symptoms persist or worsen. Patient is aware when to return to the clinic for a follow-up visit. Patient educated on when it is appropriate to go to the emergency department.  Return in about 6 months (around 04/26/2024) for chronic diseases management.    Arrie Aran Santa Lighter, [facility] [address] [place], [place] [address] [phone]    Note: This document was prepared by [employer] voice dictation technology and any errors that results from this process are unintentional.

## 2023-10-28 ENCOUNTER — Encounter: Payer: Self-pay | Admitting: Nurse Practitioner

## 2023-10-28 ENCOUNTER — Ambulatory Visit: Payer: Medicaid Other | Admitting: Nurse Practitioner

## 2023-10-28 VITALS — BP 122/83 | HR 83 | Temp 97.8°F | Ht 65.0 in | Wt 126.6 lb

## 2023-10-28 DIAGNOSIS — N913 Primary oligomenorrhea: Secondary | ICD-10-CM | POA: Insufficient documentation

## 2023-10-28 DIAGNOSIS — R21 Rash and other nonspecific skin eruption: Secondary | ICD-10-CM | POA: Insufficient documentation

## 2023-10-28 MED ORDER — HYDROCORTISONE 1 % EX LOTN
1.0000 | TOPICAL_LOTION | Freq: Two times a day (BID) | CUTANEOUS | 0 refills | Status: DC
Start: 2023-10-28 — End: 2024-03-25

## 2023-12-24 ENCOUNTER — Encounter: Payer: Self-pay | Admitting: Family Medicine

## 2023-12-24 ENCOUNTER — Ambulatory Visit: Admitting: Family Medicine

## 2023-12-24 VITALS — BP 99/61 | HR 60 | Temp 98.6°F | Ht 65.0 in | Wt 129.8 lb

## 2023-12-24 DIAGNOSIS — J301 Allergic rhinitis due to pollen: Secondary | ICD-10-CM

## 2023-12-24 MED ORDER — MONTELUKAST SODIUM 10 MG PO TABS
10.0000 mg | ORAL_TABLET | Freq: Every day | ORAL | 4 refills | Status: AC
Start: 2023-12-24 — End: ?

## 2023-12-24 MED ORDER — AZELASTINE HCL 0.1 % NA SOLN: 1.0000 | Freq: Two times a day (BID) | NASAL | 12 refills | Status: AC

## 2023-12-24 NOTE — Progress Notes (Signed)
 Subjective: CC:URI PCP: Anton Baton, NP Ruth Arellano is a 43 y.o. female presenting to clinic today for:  [employer] phone interpreter [place], [place] [phone] used for Mandarin translation of this visit  1. Sinusitis Patient reports that she has had several days of increased sinus drainage, postnasal drip, sore throat, itchy eyes and ears.  She reports symptoms are refractory to daily use of Zyrtec.  She denies any fevers, chills, hemoptysis, shortness of breath or wheezing. Patient's last menstrual period was 11/21/2023.  ROS: Per HPI  No Known Allergies No past medical history on file.  Current Outpatient Medications:    atorvastatin  (LIPITOR) 10 MG tablet, Take 1 tablet (10 mg total) by mouth daily., Disp: 30 tablet, Rfl: 11   hydrocortisone  1 % lotion, Apply 1 Application topically 2 (two) times daily., Disp: 118 mL, Rfl: 0   ketoconazole  (NIZORAL ) 2 % shampoo, Apply 1 Application topically 2 (two) times a week., Disp: 120 mL, Rfl: 2   meloxicam  (MOBIC ) 7.5 MG tablet, Take 1 tablet (7.5 mg total) by mouth daily as needed for pain., Disp: 30 tablet, Rfl: 0 Social History   Socioeconomic History   Marital status: Married    Spouse name: Not on file   Number of children: 2   Years of education: Not on file   Highest education level: Not on file  Occupational History   Occupation: Conservation officer, nature  Tobacco Use   Smoking status: Never   Smokeless tobacco: Never  Vaping Use   Vaping status: Never Used  Substance and Sexual Activity   Alcohol use: Never   Drug use: Never   Sexual activity: Not on file  Other Topics Concern   Not on file  Social History Narrative   Not on file   Social Drivers of Health   Financial Resource Strain: Not on file  Food Insecurity: No Food Insecurity (02/13/2023)   Hunger Vital Sign    Worried About Running Out of Food in the Last Year: Never true    Ran Out of Food in the Last Year: Never true  Transportation Needs: No Transportation Needs  (02/13/2023)   PRAPARE - Administrator, Civil Service (Medical): No    Lack of Transportation (Non-Medical): No  Physical Activity: Not on file  Stress: Not on file  Social Connections: Not on file  Intimate Partner Violence: Not At Risk (02/13/2023)   Humiliation, Afraid, Rape, and Kick questionnaire    Fear of Current or Ex-Partner: No    Emotionally Abused: No    Physically Abused: No    Sexually Abused: No   Family History  Problem Relation Age of Onset   Breast cancer Neg Hx     Objective: Office vital signs reviewed. BP 99/61   Pulse 60   Temp 98.6 F (37 C)   Ht 5\' 5"  (1.651 m)   Wt 129 lb 12.8 oz (58.9 kg)   LMP 11/21/2023   SpO2 100%   BMI 21.60 kg/m   Physical Examination:  General: Awake, alert, well nourished, No acute distress HEENT: Normal    Neck: No masses palpated.  Mild enlargement of the anterior cervical lymph nodes    Ears: Tympanic membranes intact, normal light reflex, no erythema, no bulging    Eyes: PERRLA, extraocular membranes intact, sclera white    Nose: nasal turbinates moist but mildly erythematous left greater than right, clear nasal discharge    Throat: moist mucus membranes, no erythema, no tonsillar exudate.  Airway is patent  Cardio: regular rate and rhythm, S1S2 heard, no murmurs appreciated Pulm: clear to auscultation bilaterally, no wheezes, rhonchi or rales; normal work of breathing on room air Assessment/ Plan: 43 y.o. female   Seasonal allergic rhinitis due to pollen - Plan: montelukast  (SINGULAIR ) 10 MG tablet, azelastine  (ASTELIN ) 0.1 % nasal spray  Will treat with Singulair  and Astelin .  Continue Zyrtec.  Would like her to have 1 month follow-up with PCP for recheck.  We briefly discussed referral to allergist as she was interested in possible allergy injections.  However, she wishes to defer this for now to see if the medications will work that are being prescribed today  Total time spent with patient 25 minutes.   Greater than 50% of encounter spent in coordination of care/counseling.  Eliodoro Guerin, DO [facility] [phone]

## 2023-12-27 IMAGING — MG MM DIGITAL DIAGNOSTIC UNILAT*R* W/ TOMO W/ CAD
4 series · 4 of 12 positions shown · non-contrast
Comparison: Previous exam(s).

CLINICAL DATA: 41-year-old female recalled from baseline screening
mammogram dated 02/07/2022 for a possible right breast asymmetry.

EXAM:
DIGITAL DIAGNOSTIC UNILATERAL RIGHT MAMMOGRAM WITH TOMOSYNTHESIS AND
CAD
TECHNIQUE: Right digital diagnostic mammography and breast tomosynthesis was
performed. The images were evaluated with computer-aided detection.

[R CC synth-2D]
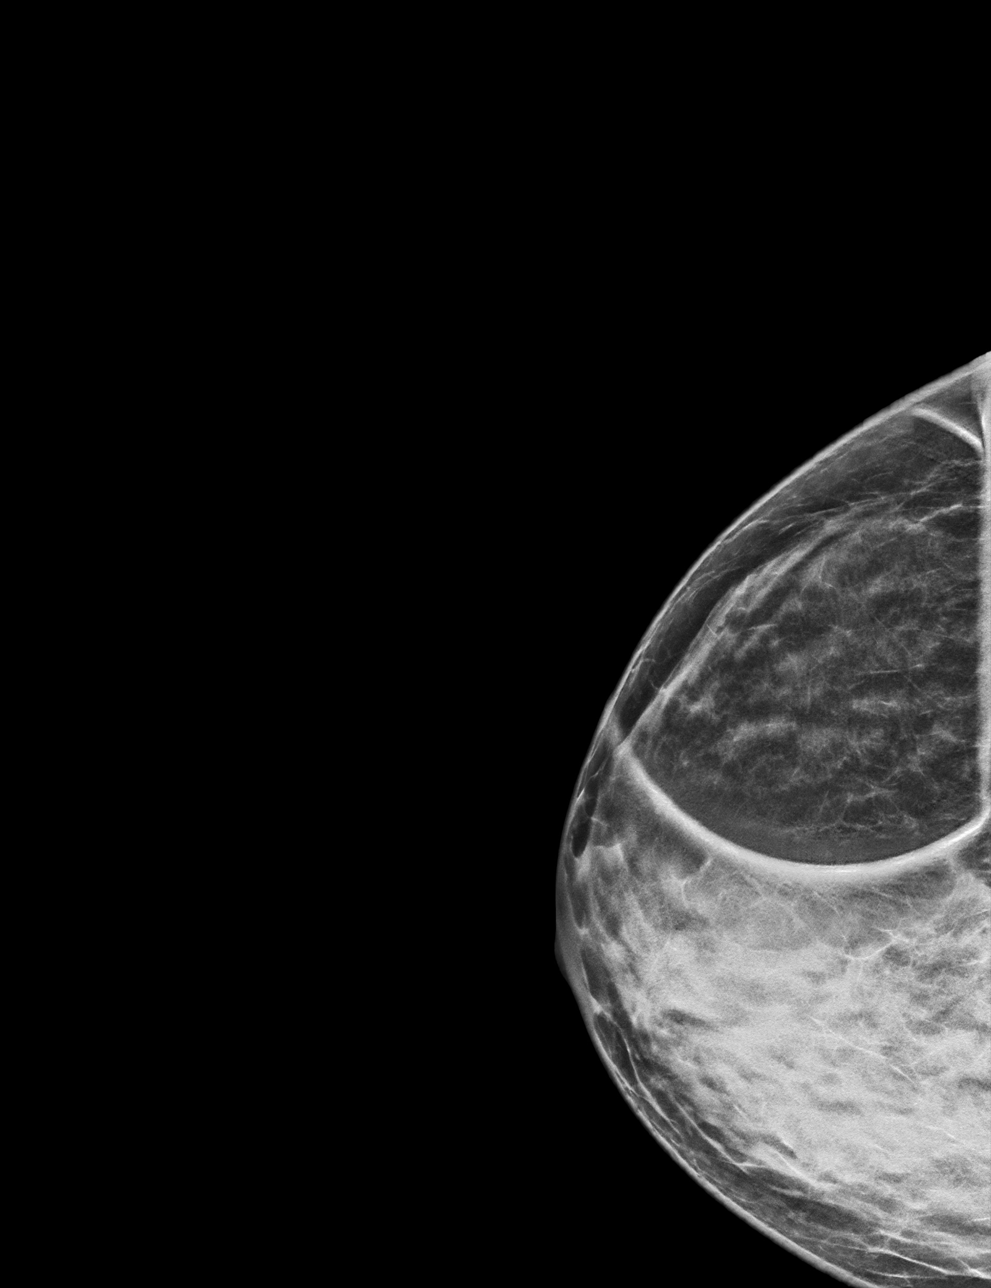

[R MLO synth-2D]
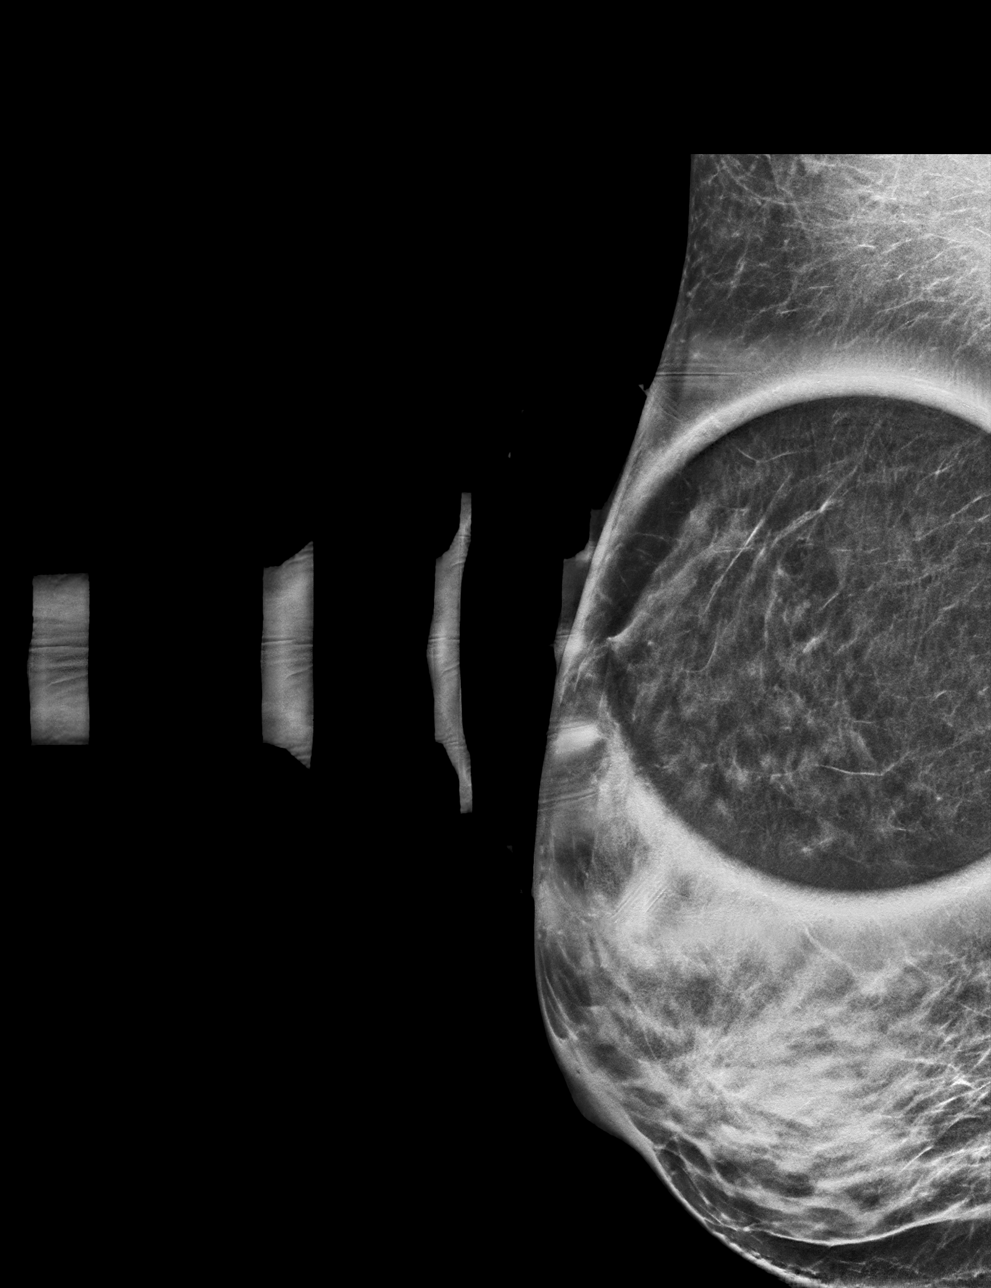

[R CC tomo · tomo slice 23/46.0]
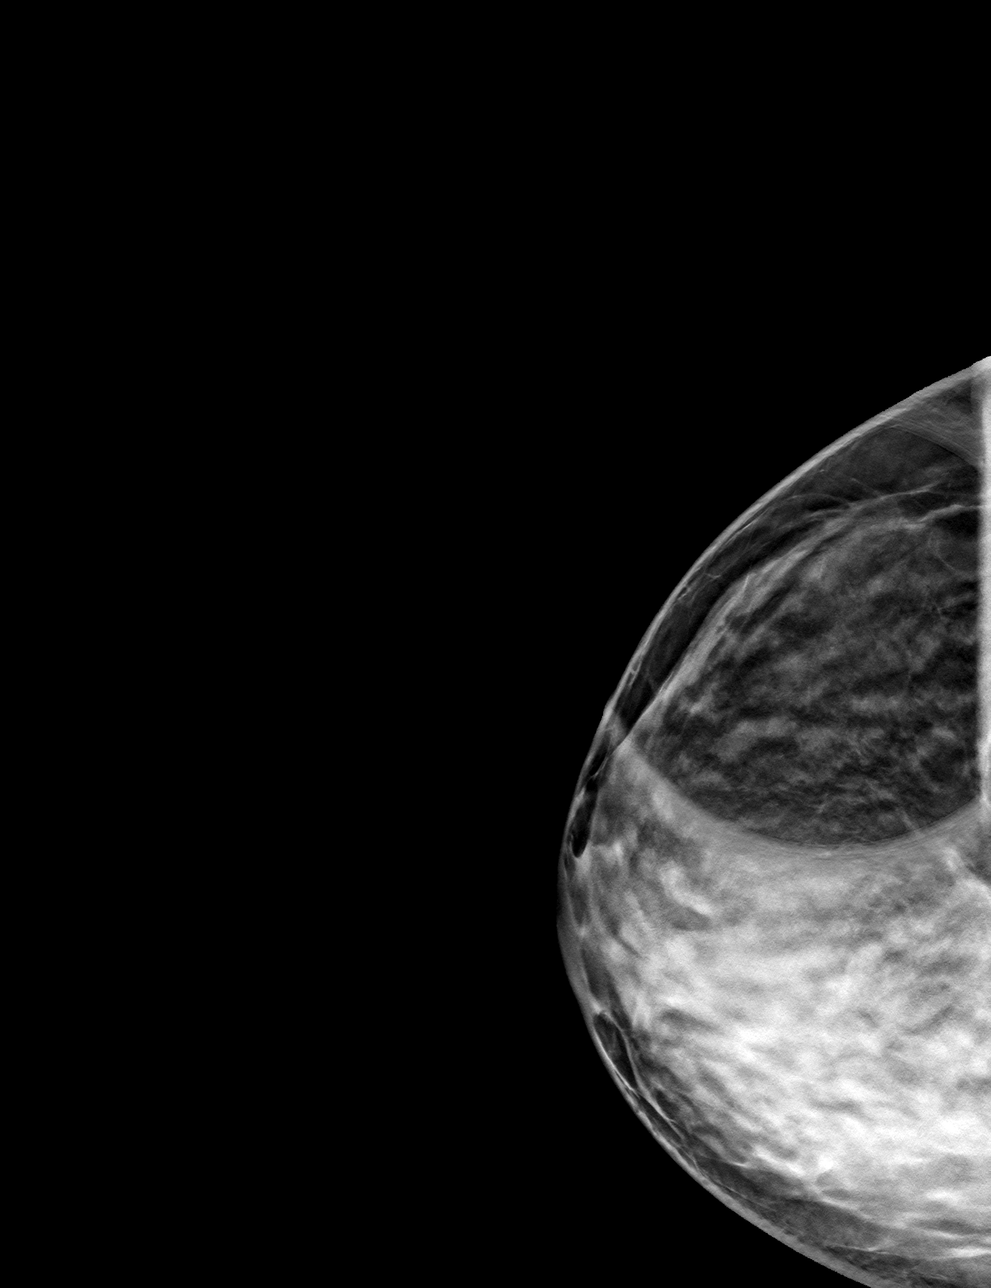

[R MLO tomo · tomo slice 23/46.0]
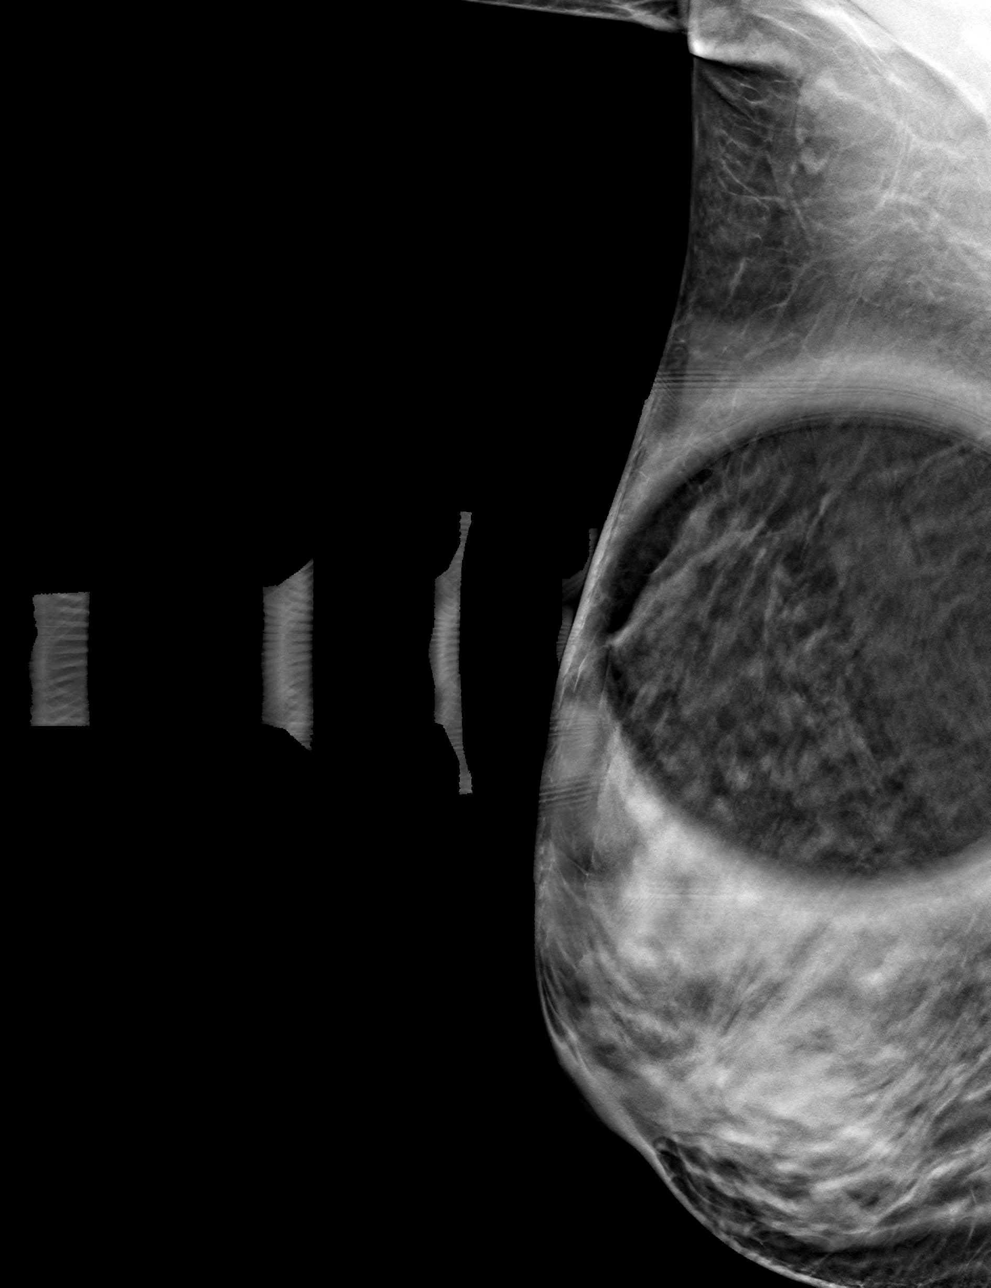

[4 of 12 positions shown; findings below may reference images not displayed]

ACR Breast Density Category d: The breast tissue is extremely dense,
which lowers the sensitivity of mammography.
FINDINGS: Possible focal asymmetry in the upper outer right breast at
posterior depth resolves into well dispersed fibroglandular tissue
on today's additional views. No suspicious findings identified.
IMPRESSION: No mammographic evidence of malignancy.

RECOMMENDATION:
Screening mammogram in one year.(Code:TA-V-IW0)

I have discussed the findings and recommendations with the patient.
If applicable, a reminder letter will be sent to the patient
regarding the next appointment.

BI-RADS CATEGORY  1: Negative.

## 2024-01-21 ENCOUNTER — Ambulatory Visit: Admitting: Nurse Practitioner

## 2024-02-19 ENCOUNTER — Encounter: Payer: Self-pay | Admitting: *Deleted

## 2024-02-25 ENCOUNTER — Other Ambulatory Visit: Payer: Self-pay | Admitting: Nurse Practitioner

## 2024-02-25 DIAGNOSIS — Z1231 Encounter for screening mammogram for malignant neoplasm of breast: Secondary | ICD-10-CM

## 2024-02-26 ENCOUNTER — Ambulatory Visit
Admission: RE | Admit: 2024-02-26 | Discharge: 2024-02-26 | Discharge status: Home / Self Care | Source: Ambulatory Visit | Attending: Nurse Practitioner | Admitting: Nurse Practitioner

## 2024-02-26 DIAGNOSIS — Z1231 Encounter for screening mammogram for malignant neoplasm of breast: Secondary | ICD-10-CM

## 2024-03-02 ENCOUNTER — Ambulatory Visit: Payer: Self-pay | Admitting: Nurse Practitioner

## 2024-03-09 ENCOUNTER — Encounter: Admitting: Nurse Practitioner

## 2024-03-25 ENCOUNTER — Encounter: Admitting: Nurse Practitioner

## 2024-03-25 ENCOUNTER — Ambulatory Visit: Admitting: Nurse Practitioner

## 2024-03-25 ENCOUNTER — Encounter: Payer: Self-pay | Admitting: Nurse Practitioner

## 2024-03-25 VITALS — BP 103/73 | HR 82 | Temp 98.0°F | Ht 65.0 in | Wt 122.6 lb

## 2024-03-25 DIAGNOSIS — N913 Primary oligomenorrhea: Secondary | ICD-10-CM | POA: Diagnosis not present

## 2024-03-25 DIAGNOSIS — E782 Mixed hyperlipidemia: Secondary | ICD-10-CM

## 2024-03-25 DIAGNOSIS — Z0001 Encounter for general adult medical examination with abnormal findings: Secondary | ICD-10-CM | POA: Diagnosis not present

## 2024-03-25 DIAGNOSIS — B350 Tinea barbae and tinea capitis: Secondary | ICD-10-CM

## 2024-03-25 DIAGNOSIS — Z0000 Encounter for general adult medical examination without abnormal findings: Secondary | ICD-10-CM

## 2024-03-25 DIAGNOSIS — R21 Rash and other nonspecific skin eruption: Secondary | ICD-10-CM

## 2024-03-25 LAB — LIPID PANEL

## 2024-03-25 MED ORDER — HYDROCORTISONE 1 % EX LOTN: 1.0000 | TOPICAL_LOTION | Freq: Two times a day (BID) | CUTANEOUS | 0 refills | Status: AC

## 2024-03-25 MED ORDER — KETOCONAZOLE 2 % EX SHAM: 1.0000 | MEDICATED_SHAMPOO | CUTANEOUS | 2 refills | Status: AC

## 2024-03-25 NOTE — Progress Notes (Signed)
 Complete physical exam  Patient: Ruth Arellano   DOB: 1981/07/15   43 y.o. Female  MRN: [id number]  Subjective:    Chief Complaint  Patient presents with   Annual Exam   [employer] video interpreter [place], [place] [id number] used for [place] Mandarin translation of this   Ruth Arellano is a 43 y.o. female who presents today for a complete physical exam. She reports consuming a general diet. The patient does not participate in regular exercise at present. She generally feels well. She reports sleeping well. She does have additional problems to discuss today  would like to [facility] and [facility] check d/t concerns for menopause. Reports cycle has become irregular and concerns for menopause  last month is was just spotting, and has been with 65-months without it . Need med refill fro Tinea Capitis    Most recent fall risk assessment:    03/25/2024    9:25 AM  Fall Risk   Falls in the past year? 0  Number falls in past yr: 0  Injury with Fall? 0  Risk for fall due to : No Fall Risks     Most recent depression screenings:    03/25/2024    9:26 AM 10/28/2023   12:54 PM  PHQ 2/9 Scores  PHQ - 2 Score 0 0  PHQ- 9 Score 0 0      03/25/2024    9:46 AM  MMSE - Mini Mental State Exam  Orientation to time 5  Orientation to Place 5  Registration 3  Attention/ Calculation 5  Recall 3  Language- name 2 objects 2  Language- repeat 1  Language- follow 3 step command 3  Language- read & follow direction 1  Write a sentence 1  Copy design 1  Total score 30    Vision:Within last year, Dental: No current dental problems, and STD: The patient denies history of sexually transmitted disease.  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 No pertinent past medical history. Past Surgical History:  Procedure Laterality Date   APPENDECTOMY     CESAREAN SECTION     2    ECTOPIC PREGNANCY SURGERY     Social History   Tobacco Use   Smoking status: Never   Smokeless tobacco: Never  Vaping Use   Vaping status: Never Used  Substance Use Topics   Alcohol use: Never   Drug use: Never   Social History   Socioeconomic History   Marital status: Married    Spouse name: Not on file   Number of children: 2   Years of education: Not on file   Highest education level: Not on file  Occupational History   Occupation: Conservation officer, nature  Tobacco Use   Smoking status: Never   Smokeless tobacco: Never  Vaping Use   Vaping status: Never Used  Substance and Sexual Activity   Alcohol use: Never   Drug use: Never   Sexual activity: Not on  file  Other Topics Concern   Not on file  Social History Narrative   Not on file   Social Drivers of Health   Financial Resource Strain: Low Risk  (03/25/2024)   Overall Financial Resource Strain (CARDIA)    Difficulty of Paying Living Expenses: Not hard at all  Food Insecurity: No Food Insecurity (03/25/2024)   Hunger Vital Sign    Worried About Running Out of Food in the Last Year: Never true    Ran Out of Food in the Last Year: Never true  Transportation Needs: No Transportation Needs (03/25/2024)   PRAPARE - Administrator, Civil Service (Medical): No    Lack of Transportation (Non-Medical): No  Physical Activity: Not on file  Stress: Not on file  Social Connections: Not on file  Intimate Partner Violence: Unknown (03/25/2024)   Humiliation, Afraid, Rape, and Kick questionnaire     Fear of Current or Ex-Partner: No    Emotionally Abused: Not on file    Physically Abused: No    Sexually Abused: No   Family Status  Relation Name Status   Mother  Alive   Father  Deceased   Sister  Alive   Sister  Alive   Sister  Alive   Sister  Alive   Daughter  Alive   Brother  Alive   Son  Alive   Neg Hx  (Not Specified)  No partnership data on file   Family History  Problem Relation Age of Onset   Breast cancer Neg Hx    No Known Allergies    Patient Care Team: [facility] Hummer, Nena, NP as PCP - General (Nurse Practitioner)   Outpatient Medications Prior to Visit  Medication Sig   atorvastatin  (LIPITOR) 10 MG tablet Take 1 tablet (10 mg total) by mouth daily.   azelastine  (ASTELIN ) 0.1 % nasal spray Place 1 spray into both nostrils 2 (two) times daily. For runny nose   meloxicam  (MOBIC ) 7.5 MG tablet Take 1 tablet (7.5 mg total) by mouth daily as needed for pain.   montelukast  (SINGULAIR ) 10 MG tablet Take 1 tablet (10 mg total) by mouth at bedtime. For allergies   [DISCONTINUED] hydrocortisone  1 % lotion Apply 1 Application topically 2 (two) times daily.   [DISCONTINUED] ketoconazole  (NIZORAL ) 2 % shampoo Apply 1 Application topically 2 (two) times a week.   No facility-administered medications prior to visit.    Review of Systems  Constitutional:  Negative for chills and fever.  HENT:  Negative for congestion and sore throat.   Eyes:  Negative for pain and redness.  Respiratory:  Negative for cough and shortness of breath.   Cardiovascular:  Negative for chest pain and leg swelling.  Gastrointestinal:  Negative for constipation, diarrhea, melena, nausea and vomiting.  Genitourinary:  Negative for dysuria, flank pain and frequency.  Musculoskeletal:  Negative for falls.  Skin:  Negative for itching and rash.  Neurological:  Negative for dizziness and headaches.  Endo/Heme/Allergies:        Abnormal/missing cycles  Psychiatric/Behavioral:  Negative  for suicidal ideas. The patient does not have insomnia.    Negative unless indicated in HPI    Objective:     BP 103/73   Pulse 82   Temp 98 F (36.7 C) (Temporal)   Ht 5' 5 (1.651 m)   Wt 122 lb 9.6 oz (55.6 kg)   SpO2 97%   BMI 20.40 kg/m  BP Readings from Last 3 Encounters:  03/25/24 103/73  12/24/23 99/61  10/28/23 122/83   Wt Readings from Last 3 Encounters:  03/25/24 122 lb 9.6 oz (55.6 kg)  12/24/23 129 lb 12.8 oz (58.9 kg)  10/28/23 126 lb 9.6 oz (57.4 kg)      Physical Exam Vitals and nursing note reviewed.  Constitutional:      General: She is not in acute distress.    Appearance: She is normal weight.  HENT:     Head: Normocephalic and atraumatic.     Right Ear: Tympanic membrane, ear canal and external ear normal. There is no impacted cerumen.     Left Ear: Tympanic membrane, ear canal and external ear normal. There is no impacted cerumen.     Nose: Nose normal.  Eyes:     General: No scleral icterus.    Extraocular Movements: Extraocular movements intact.     Conjunctiva/sclera: Conjunctivae normal.     Pupils: Pupils are equal, round, and reactive to light.  Cardiovascular:     Heart sounds: Normal heart sounds.  Pulmonary:     Effort: Pulmonary effort is normal.     Breath sounds: Normal breath sounds.  Abdominal:     General: Bowel sounds are normal.     Palpations: Abdomen is soft.     Tenderness: There is no abdominal tenderness. There is no rebound.  Musculoskeletal:        General: Normal range of motion.     Right lower leg: No edema.     Left lower leg: No edema.  Skin:    General: Skin is warm and dry.     Capillary Refill: Capillary refill takes less than 2 seconds.     Findings: No rash.  Neurological:     General: No focal deficit present.     Mental Status: She is alert and oriented to person, place, and time.  Psychiatric:        Mood and Affect: Mood normal.        Behavior: Behavior normal.        Thought Content:  Thought content normal.        Judgment: Judgment normal.      No results found for any visits on 03/25/24. Last CBC Lab Results  Component Value Date   WBC 3.5 02/13/2023   HGB 12.6 02/13/2023   HCT 38.9 02/13/2023   MCV 99 (H) 02/13/2023   MCH 32.0 02/13/2023   RDW 12.5 02/13/2023   PLT 247 02/13/2023   Last metabolic panel Lab Results  Component Value Date   GLUCOSE 87 02/13/2023   NA 137 02/13/2023   K 4.0 02/13/2023   CL 101 02/13/2023   CO2 19 (L) 02/13/2023   BUN 13 02/13/2023   CREATININE 0.66 02/13/2023   EGFR 113 02/13/2023   CALCIUM  8.8 02/13/2023   PROT 7.6 02/13/2023   ALBUMIN 4.7 02/13/2023   LABGLOB 2.9 02/13/2023   AGRATIO 1.6 02/13/2023   BILITOT 0.7 02/13/2023   ALKPHOS 32 (L) 02/13/2023   AST 17 02/13/2023   ALT 19 02/13/2023   Last lipids Lab Results  Component Value Date   CHOL 309 (H) 02/13/2023   HDL 81 02/13/2023   LDLCALC 213 (H) 02/13/2023   TRIG 94 02/13/2023   CHOLHDL 3.8 02/13/2023   Last thyroid  functions Lab Results  Component Value Date   TSH 0.797 02/13/2023   T4TOTAL 8.2 02/13/2023        Assessment & Plan:    Routine Health Maintenance and Physical Exam  Discussed health  benefits of physical activity, and encouraged her to engage in regular exercise appropriate for her age and condition.  Annual physical exam -     CBC with Differential/Platelet -     CMP14+EGFR -     Lipid panel -     TSH  Mixed hyperlipidemia -     Lipid panel -     TSH  Primary oligomenorrhea -     FSH/LH  Tinea capitis -     Ketoconazole ; Apply 1 Application topically 2 (two) times a week.  Dispense: 120 mL; Refill: 2  Rash of back -     Hydrocortisone ; Apply 1 Application topically 2 (two) times daily.  Dispense: 118 mL; Refill: 0   Yumalay is a 43 year old [place] female seen today for annual physical no acute Oligomenorrhea: FSH and LH result pending Labs: CBC, CMP, lipid, TSH result pending Hyperlipidemia: Lipid and TSH  ordered result pending Tinea capitis: Hydrocortisone  and ketoconazole  shampoo refilled Health maintenance: Up-to-date Return in about 6 months (around 09/25/2024) for chronic Diseases Management.     Aashi Derrington [place], DNP [facility] [address] [place], [place] [address] [phone]

## 2024-03-26 ENCOUNTER — Ambulatory Visit: Payer: Self-pay | Admitting: Nurse Practitioner

## 2024-03-26 LAB — LIPID PANEL
Cholesterol, Total: 197 mg/dL (ref 100–199)
HDL: 62 mg/dL (ref >39)
LDL CALC COMMENT:: 3.2 ratio (ref 0.0–4.4)
LDL Chol Calc (NIH): 122 mg/dL — AB (ref 0–99)
Triglycerides: 73 mg/dL (ref 0–149)
VLDL Cholesterol Cal: 13 mg/dL (ref 5–40)

## 2024-03-26 LAB — CBC WITH DIFFERENTIAL/PLATELET
Basophils Absolute: 0 x10E3/uL (ref 0.0–0.2)
Basos: 0 %
EOS (ABSOLUTE): 0.1 x10E3/uL (ref 0.0–0.4)
Eos: 2 %
Hematocrit: 37.4 % (ref 34.0–46.6)
Hemoglobin: 12.2 g/dL (ref 11.1–15.9)
Immature Grans (Abs): 0 x10E3/uL (ref 0.0–0.1)
Immature Granulocytes: 0 %
Lymphocytes Absolute: 1.5 x10E3/uL (ref 0.7–3.1)
Lymphs: 43 %
MCH: 31.8 pg (ref 26.6–33.0)
MCHC: 32.6 g/dL (ref 31.5–35.7)
MCV: 97 fL (ref 79–97)
Monocytes Absolute: 0.2 x10E3/uL (ref 0.1–0.9)
Monocytes: 6 %
Neutrophils Absolute: 1.7 x10E3/uL (ref 1.4–7.0)
Neutrophils: 49 %
Platelets: 219 x10E3/uL (ref 150–450)
RBC: 3.84 x10E6/uL (ref 3.77–5.28)
RDW: 12 % (ref 11.7–15.4)
WBC: 3.4 x10E3/uL (ref 3.4–10.8)

## 2024-03-26 LAB — CMP14+EGFR
ALT: 19 IU/L (ref 0–32)
AST: 16 IU/L (ref 0–40)
Albumin: 4.6 g/dL (ref 3.9–4.9)
Alkaline Phosphatase: 35 IU/L — AB (ref 44–121)
BUN/Creatinine Ratio: 17 (ref 9–23)
BUN: 11 mg/dL (ref 6–24)
Bilirubin Total: 0.7 mg/dL (ref 0.0–1.2)
CO2: 21 mmol/L (ref 20–29)
Calcium: 8.9 mg/dL (ref 8.7–10.2)
Chloride: 104 mmol/L (ref 96–106)
Creatinine, Ser: 0.64 mg/dL (ref 0.57–1.00)
Globulin, Total: 2.6 g/dL (ref 1.5–4.5)
Glucose: 89 mg/dL (ref 70–99)
Potassium: 3.9 mmol/L (ref 3.5–5.2)
Sodium: 139 mmol/L (ref 134–144)
Total Protein: 7.2 g/dL (ref 6.0–8.5)
eGFR: 113 mL/min/1.73 (ref >59)

## 2024-03-26 LAB — FSH/LH
FSH: 5.3 m[IU]/mL
LH: 13 m[IU]/mL

## 2024-03-26 LAB — TSH: TSH: 1.11 u[IU]/mL (ref 0.450–4.500)

## 2024-05-28 ENCOUNTER — Ambulatory Visit: Payer: Self-pay | Admitting: *Deleted

## 2024-05-28 ENCOUNTER — Encounter: Payer: Self-pay | Admitting: Nurse Practitioner

## 2024-05-28 ENCOUNTER — Ambulatory Visit: Admitting: Nurse Practitioner

## 2024-05-28 ENCOUNTER — Ambulatory Visit (INDEPENDENT_AMBULATORY_CARE_PROVIDER_SITE_OTHER)

## 2024-05-28 VITALS — BP 99/65 | HR 79 | Temp 97.8°F | Ht 65.0 in | Wt 123.6 lb

## 2024-05-28 DIAGNOSIS — M545 Low back pain, unspecified: Secondary | ICD-10-CM | POA: Diagnosis not present

## 2024-05-28 DIAGNOSIS — G8929 Other chronic pain: Secondary | ICD-10-CM | POA: Insufficient documentation

## 2024-05-28 DIAGNOSIS — Z638 Other specified problems related to primary support group: Secondary | ICD-10-CM | POA: Insufficient documentation

## 2024-05-28 MED ORDER — PREDNISONE 10 MG PO TABS
10.0000 mg | ORAL_TABLET | Freq: Every day | ORAL | 0 refills | Status: AC
Start: 2024-05-28 — End: ?

## 2024-05-28 MED ORDER — TIZANIDINE HCL 4 MG PO TABS: 4.0000 mg | ORAL_TABLET | Freq: Every day | ORAL | 0 refills | Status: AC

## 2024-05-28 NOTE — Progress Notes (Signed)
 Subjective:  Patient ID: Ruth Arellano, female    DOB: Sep 05, 1980, 43 y.o.   MRN: [id number]  Patient Care Team: Deitra Morton Sebastian Nena, NP as PCP - General (Nurse Practitioner)   Chief Complaint:  Back Pain (Lower back pain 4-5 years )   HPI: Ruth Arellano is a 43 y.o. female presenting on 05/28/2024 for Back Pain (Lower back pain 4-5 years )    Str[employer] video interpreter Alejo, LOUISIANA [id number] used for Mandarin translation of this visit A second interpreter came on the line as patient was having trouble understanding the interpreter, and ask for new person  [employer] video interpreter weiong, ID [phone]  for Mandarin translation of this Discussed the use of AI scribe software for clinical note transcription with the patient, who gave verbal consent to proceed.  History of Present Illness  Ruth Arellano is a 43 year old female who presents with worsening back pain.  She has been experiencing back pain for the past four to five years, which has recently intensified over the last few days. The pain is primarily nocturnal, especially when lying flat, and is described as constant and severe. During the day, the pain is less noticeable, but today it has been more pronounced, making it difficult for her to get out of bed in the morning.  She rates the pain as a 6 to 7 out of 10. She has been using Tylenol for relief, which provides short-term alleviation but is not a sustainable solution. She has not been taking any other medications regularly for this pain. She believes the pain is due to epidural administered 5 yrs ago while giving birth. She works at [employer] and on feet all day dn doing heavy lifting.    Relevant past medical, surgical, family, and social history reviewed and updated as indicated.  Allergies and medications reviewed and updated. Data reviewed: Chart in Epic.   History reviewed. No pertinent past medical history.  Past Surgical History:  Procedure Laterality Date    APPENDECTOMY     CESAREAN SECTION     2    ECTOPIC PREGNANCY SURGERY      Social History   Socioeconomic History   Marital status: Married    Spouse name: Not on file   Number of children: 2   Years of education: Not on file   Highest education level: Not on file  Occupational History   Occupation: Conservation officer, nature  Tobacco Use   Smoking status: Never   Smokeless tobacco: Never  Vaping Use   Vaping status: Never Used  Substance and Sexual Activity   Alcohol use: Never   Drug use: Never   Sexual activity: Not on file  Other Topics Concern   Not on file  Social History Narrative   Not on file   Social Drivers of Health   Financial Resource Strain: Low Risk  (03/25/2024)   Overall Financial Resource Strain (CARDIA)    Difficulty of Paying Living Expenses: Not hard at all  Food Insecurity: No Food Insecurity (03/25/2024)   Hunger Vital Sign    Worried About Running Out of Food in the Last Year: Never true    Ran Out of Food in the Last Year: Never true  Transportation Needs: No Transportation Needs (03/25/2024)   PRAPARE - Administrator, Civil Service (Medical): No    Lack of Transportation (Non-Medical): No  Physical Activity: Not on file  Stress: Not on file  Social Connections: Not on file  Intimate Partner  Violence: Unknown (03/25/2024)   Humiliation, Afraid, Rape, and Kick questionnaire    Fear of Current or Ex-Partner: No    Emotionally Abused: Not on file    Physically Abused: No    Sexually Abused: No    Outpatient Encounter Medications as of 05/28/2024  Medication Sig   atorvastatin  (LIPITOR) 10 MG tablet Take 1 tablet (10 mg total) by mouth daily.   azelastine  (ASTELIN ) 0.1 % nasal spray Place 1 spray into both nostrils 2 (two) times daily. For runny nose   hydrocortisone  1 % lotion Apply 1 Application topically 2 (two) times daily.   ketoconazole  (NIZORAL ) 2 % shampoo Apply 1 Application topically 2 (two) times a week.   meloxicam  (MOBIC ) 7.5 MG tablet  Take 1 tablet (7.5 mg total) by mouth daily as needed for pain.   montelukast  (SINGULAIR ) 10 MG tablet Take 1 tablet (10 mg total) by mouth at bedtime. For allergies   predniSONE  (DELTASONE ) 10 MG tablet Take 1 tablet (10 mg total) by mouth daily with breakfast.   tiZANidine  (ZANAFLEX ) 4 MG tablet Take 1 tablet (4 mg total) by mouth at bedtime.   No facility-administered encounter medications on file as of 05/28/2024.    No Known Allergies  Pertinent ROS per HPI, otherwise unremarkable      Objective:  BP 99/65   Pulse 79   Temp 97.8 F (36.6 C) (Temporal)   Ht 5' 5 (1.651 m)   Wt 123 lb 9.6 oz (56.1 kg)   SpO2 97%   BMI 20.57 kg/m    Wt Readings from Last 3 Encounters:  05/28/24 123 lb 9.6 oz (56.1 kg)  03/25/24 122 lb 9.6 oz (55.6 kg)  12/24/23 129 lb 12.8 oz (58.9 kg)    Physical Exam Vitals and nursing note reviewed.  Constitutional:      Appearance: Normal appearance.  HENT:     Head: Normocephalic and atraumatic.     Nose: Nose normal.  Eyes:     General: No scleral icterus.    Extraocular Movements: Extraocular movements intact.     Conjunctiva/sclera: Conjunctivae normal.     Pupils: Pupils are equal, round, and reactive to light.  Cardiovascular:     Heart sounds: Normal heart sounds.  Pulmonary:     Breath sounds: Normal breath sounds.  Abdominal:     General: Bowel sounds are normal.     Palpations: Abdomen is soft.  Musculoskeletal:     Cervical back: Normal. No swelling.     Thoracic back: Normal.     Lumbar back: Tenderness present. No swelling. Negative right straight leg raise test and negative left straight leg raise test.     Right lower leg: No edema.     Left lower leg: No edema.  Skin:    General: Skin is warm and dry.     Findings: No rash.  Neurological:     Mental Status: She is alert and oriented to person, place, and time.  Psychiatric:        Mood and Affect: Mood normal.        Behavior: Behavior normal.        Judgment:  Judgment normal.    Physical Exam      Results for orders placed or performed in visit on 03/25/24  CBC with Differential/Platelet   Collection Time: 03/25/24  9:50 AM  Result Value Ref Range   WBC 3.4 3.4 - 10.8 x10E3/uL   RBC 3.84 3.77 - 5.28 x10E6/uL   Hemoglobin 12.2  11.1 - 15.9 g/dL   Hematocrit 62.5 65.9 - 46.6 %   MCV 97 79 - 97 fL   MCH 31.8 26.6 - 33.0 pg   MCHC 32.6 31.5 - 35.7 g/dL   RDW 87.9 88.2 - 84.5 %   Platelets 219 150 - 450 x10E3/uL   Neutrophils 49 Not Estab. %   Lymphs 43 Not Estab. %   Monocytes 6 Not Estab. %   Eos 2 Not Estab. %   Basos 0 Not Estab. %   Neutrophils Absolute 1.7 1.4 - 7.0 x10E3/uL   Lymphocytes Absolute 1.5 0.7 - 3.1 x10E3/uL   Monocytes Absolute 0.2 0.1 - 0.9 x10E3/uL   EOS (ABSOLUTE) 0.1 0.0 - 0.4 x10E3/uL   Basophils Absolute 0.0 0.0 - 0.2 x10E3/uL   Immature Granulocytes 0 Not Estab. %   Immature Grans (Abs) 0.0 0.0 - 0.1 x10E3/uL  CMP14+EGFR   Collection Time: 03/25/24  9:50 AM  Result Value Ref Range   Glucose 89 70 - 99 mg/dL   BUN 11 6 - 24 mg/dL   Creatinine, Ser 9.35 0.57 - 1.00 mg/dL   eGFR 886 >40 fO/fpw/8.26   BUN/Creatinine Ratio 17 9 - 23   Sodium 139 134 - 144 mmol/L   Potassium 3.9 3.5 - 5.2 mmol/L   Chloride 104 96 - 106 mmol/L   CO2 21 20 - 29 mmol/L   Calcium  8.9 8.7 - 10.2 mg/dL   Total Protein 7.2 6.0 - 8.5 g/dL   Albumin 4.6 3.9 - 4.9 g/dL   Globulin, Total 2.6 1.5 - 4.5 g/dL   Bilirubin Total 0.7 0.0 - 1.2 mg/dL   Alkaline Phosphatase 35 (L) 44 - 121 IU/L   AST 16 0 - 40 IU/L   ALT 19 0 - 32 IU/L  Lipid panel   Collection Time: 03/25/24  9:50 AM  Result Value Ref Range   Cholesterol, Total 197 100 - 199 mg/dL   Triglycerides 73 0 - 149 mg/dL   HDL 62 >60 mg/dL   VLDL Cholesterol Cal 13 5 - 40 mg/dL   LDL Chol Calc (NIH) 877 (H) 0 - 99 mg/dL   Chol/HDL Ratio 3.2 0.0 - 4.4 ratio  TSH   Collection Time: 03/25/24  9:50 AM  Result Value Ref Range   TSH 1.110 0.450 - 4.500 uIU/mL  FSH/LH    Collection Time: 03/25/24  9:50 AM  Result Value Ref Range   LH 13.0 mIU/mL   FSH 5.3 mIU/mL       Pertinent labs & imaging results that were available during my care of the patient were reviewed by me and considered in my medical decision making.  Assessment & Plan:  Ruth Arellano was seen today for back pain.  Diagnoses and all orders for this visit:  Chronic midline low back pain without sciatica -     predniSONE  (DELTASONE ) 10 MG tablet; Take 1 tablet (10 mg total) by mouth daily with breakfast. -     tiZANidine  (ZANAFLEX ) 4 MG tablet; Take 1 tablet (4 mg total) by mouth at bedtime. -     DG Lumbar Spine 2-3 Views   Meijiin is a 43 year old Asian female seen today for back pain, no acute distress  Assessment and Plan Assessment & Plan Chronic low back pain Chronic low back pain exacerbated, affecting sleep. Current acetaminophen use provides short-term relief but is not sustainable long-term. - Discussed alternative pain management including NSAIDs or other analgesics. - Consider referral to physical therapy for chronic low back pain  management. - Educated on sleep positions and ergonomic adjustments to reduce back strain.    -Prednisone  twice daily for 5 days take with food -Zanaflex  nightly at bedtime - Heat pad every 15 minutes as tolerated - Future may consider referral to physical therapy  Continue all other maintenance medications.  Follow up plan: Return if symptoms worsen or fail to improve.   Continue healthy lifestyle choices, including diet (rich in fruits, vegetables, and lean proteins, and low in salt and simple carbohydrates) and exercise (at least 30 minutes of moderate physical activity daily).  Educational handout given for  Chronic Pain, Adult Chronic pain is a type of pain that lasts or keeps coming back for at least 3-6 months. You may have headaches, pain in the abdomen, or pain in other areas of the body. Chronic pain may be related to an illness,  injury, or a health condition. Sometimes, the cause of chronic pain is not known. Chronic pain can make it hard for you to do daily activities. If it is not treated, chronic pain can lead to anxiety and depression. Treatment depends on the cause of your pain and how severe it is. You may need to work with a pain specialist to come up with a treatment plan. Many people benefit from two or more types of treatment to control their pain. Follow these instructions at home: Treatment plan Follow your treatment plan as told by your health care provider. This may include: Gentle, regular exercise. Eating a healthy diet that includes foods such as vegetables, fruits, fish, and lean meats. Mental health therapy (cognitive or behavioral therapy) that changes the way you think or act in response to the pain. This may help improve how you feel. Doing physical therapy exercises to improve movement and strength. Meditation, yoga, acupuncture, or massage therapy. Using the oils from plants in your environment or on your skin (aromatherapy). Other treatments may include: Over-the-counter or prescription medicines. Color, light, or sound therapy. Local electrical stimulation. The electrical pulses help to relieve pain by temporarily stopping the nerve impulses that cause you to feel pain. Injections. These deliver numbing or pain-relieving medicines into the spine or the area of pain.   Medicines Take over-the-counter and prescription medicines only as told by your health care provider. Ask your health care provider if the medicine prescribed to you: Requires you to avoid driving or using machinery. Can cause constipation. You may need to take these actions to prevent or treat constipation: Drink enough fluid to keep your urine pale yellow. Take over-the-counter or prescription medicines. Eat foods that are high in fiber, such as beans, whole grains, and fresh fruits and vegetables. Limit foods that are high  in fat and processed sugars, such as fried or sweet foods. Lifestyle  Ask your health care provider whether you should keep a pain diary. Your health care provider will tell you what information to write in the diary. This may include: When you have pain. What the pain feels like. How medicines and other behaviors or treatments help to reduce the pain. Consider talking with a mental health care provider about how to help manage chronic pain. Consider joining a chronic pain support group. Try to control or lower your stress levels. Talk with your health care provider about ways to do this. General instructions Learn as much as you can about how to manage your chronic pain. Ask your health care provider if an intensive pain rehabilitation program or a chronic pain specialist would be helpful. Check  your pain level as told by your health care provider. Ask your health care provider if you should use a pain scale. Contact a health care provider if: Your pain is not controlled with treatment. You have new pain. You have side effects from pain medicine. You feel weak or you have trouble doing your normal activities. You have trouble sleeping or you develop confusion. You lose feeling or have numbness in your body. You lose control of your bowels or bladder. Get help right away if: Your pain suddenly gets much worse. You develop chest pain. You have trouble breathing or shortness of breath. You faint, or another person sees you faint. These symptoms may be an emergency. Get help right away. Call 911. Do not wait to see if the symptoms will go away. Do not drive yourself to the hospital. Also, get help right away if: You have thoughts about hurting yourself or others. Take one of these steps if you feel like you may hurt yourself or others, or have thoughts about taking your own life: Go to your nearest emergency room. Call 911. Call the National Suicide Prevention Lifeline at  463-225-1767 or 988. This is open 24 hours a day. Text the Crisis Text Line at (864)347-5994. This information is not intended to replace advice given to you by your health care provider. Make sure you discuss any questions you have with your health care provider. Document Revised: 04/11/2022 Document Reviewed: 03/14/2022 Elsevier Patient Education  2024 Elsevier Inc. Managing Pain Without Opioids Opioids are strong medicines used to treat moderate to severe pain. For some people, especially those who have long-term (chronic) pain, opioids may not be the best choice for pain management due to: Side effects like nausea, constipation, and sleepiness. The risk of addiction (opioid use disorder). The longer you take opioids, the greater your risk of addiction. Pain that lasts for more than 3 months is called chronic pain. Managing chronic pain usually requires more than one approach and is often provided by a team of health care providers working together (multidisciplinary approach). Pain management may be done at a pain management center or pain clinic. How to manage pain without the use of opioids Use non-opioid medicines Non-opioid medicines for pain may include: Over-the-counter or prescription non-steroidal anti-inflammatory drugs (NSAIDs). These may be the first medicines used for pain. They work well for muscle and bone pain, and they reduce swelling. Acetaminophen. This over-the-counter medicine may work well for milder pain but not swelling. Antidepressants. These may be used to treat chronic pain. A certain type of antidepressant (tricyclics) is often used. These medicines are given in lower doses for pain than when used for depression. Anticonvulsants. These are usually used to treat seizures but may also reduce nerve (neuropathic) pain. Muscle relaxants. These relieve pain caused by sudden muscle tightening (spasms). You may also use a pain medicine that is applied to the skin as a patch,  cream, or gel (topical analgesic), such as a numbing medicine. These may cause fewer side effects than medicines taken by mouth. Do certain therapies as directed Some therapies can help with pain management. They include: Physical therapy. You will do exercises to gain strength and flexibility. A physical therapist may teach you exercises to move and stretch parts of your body that are weak, stiff, or painful. You can learn these exercises at physical therapy visits and practice them at home. Physical therapy may also involve: Massage. Heat wraps or applying heat or cold to affected areas. Electrical signals that  interrupt pain signals (transcutaneous electrical nerve stimulation, TENS). Weak lasers that reduce pain and swelling (low-level laser therapy). Signals from your body that help you learn to regulate pain (biofeedback). Occupational therapy. This helps you to learn ways to function at home and work with less pain. Recreational therapy. This involves trying new activities or hobbies, such as a physical activity or drawing. Mental health therapy, including: Cognitive behavioral therapy (CBT). This helps you learn coping skills for dealing with pain. Acceptance and commitment therapy (ACT) to change the way you think and react to pain. Relaxation therapies, including muscle relaxation exercises and mindfulness-based stress reduction. Pain management counseling. This may be individual, family, or group counseling.   Receive medical treatments Medical treatments for pain management include: Nerve block injections. These may include a pain blocker and anti-inflammatory medicines. You may have injections: Near the spine to relieve chronic back or neck pain. Into joints to relieve back or joint pain. Into nerve areas that supply a painful area to relieve body pain. Into muscles (trigger point injections) to relieve some painful muscle conditions. A medical device placed near your spine to  help block pain signals and relieve nerve pain or chronic back pain (spinal cord stimulation device). Acupuncture. Follow these instructions at home Medicines Take over-the-counter and prescription medicines only as told by your health care provider. If you are taking pain medicine, ask your health care providers about possible side effects to watch out for. Do not drive or use heavy machinery while taking prescription opioid pain medicine. Lifestyle  Do not use drugs or alcohol to reduce pain. If you drink alcohol, limit how much you have to: 0-1 drink a day for women who are not pregnant. 0-2 drinks a day for men. Know how much alcohol is in a drink. In the [place] one drink equals one 12 oz bottle of beer (355 mL), one 5 oz glass of wine (148 mL), or one 1 oz glass of hard liquor (44 mL). Do not use any products that contain nicotine or tobacco. These products include cigarettes, chewing tobacco, and vaping devices, such as e-cigarettes. If you need help quitting, ask your health care provider. Eat a healthy diet and maintain a healthy weight. Poor diet and excess weight may make pain worse. Eat foods that are high in fiber. These include fresh fruits and vegetables, whole grains, and beans. Limit foods that are high in fat and processed sugars, such as fried and sweet foods. Exercise regularly. Exercise lowers stress and may help relieve pain. Ask your health care provider what activities and exercises are safe for you. If your health care provider approves, join an exercise class that combines movement and stress reduction. Examples include yoga and tai chi. Get enough sleep. Lack of sleep may make pain worse. Lower stress as much as possible. Practice stress reduction techniques as told by your therapist. General instructions Work with all your pain management providers to find the treatments that work best for you. You are an important member of your pain management team. There are many  things you can do to reduce pain on your own. Consider joining an online or in-person support group for people who have chronic pain. Keep all follow-up visits. This is important. Where to find more information You can find more information about managing pain without opioids from: American Academy of Pain Medicine: painmed.org Institute for Chronic Pain: instituteforchronicpain.org American Chronic Pain Association: theacpa.org Contact a health care provider if: You have side effects from pain medicine.  Your pain gets worse or does not get better with treatments or home therapy. You are struggling with anxiety or depression. Summary Many types of pain can be managed without opioids. Chronic pain may respond better to pain management without opioids. Pain is best managed when you and a team of health care providers work together. Pain management without opioids may include non-opioid medicines, medical treatments, physical therapy, mental health therapy, and lifestyle changes. Tell your health care providers if your pain gets worse or is not being managed well enough. This information is not intended to replace advice given to you by your health care provider. Make sure you discuss any questions you have with your health care provider. Document Revised: 11/30/2020 Document Reviewed: 11/30/2020 Elsevier Patient Education  2024 Elsevier Inc. Managing Chronic Back Pain Chronic back pain is pain that lasts longer than 3 months. It often affects the lower back. It may feel like a muscle ache or a sharp, stabbing pain. It can be mild, moderate, or severe. There are things you can do to help manage your pain. See what works best for you. Your health care provider may also give you other instructions. What actions can I take to manage my chronic back pain? You may be given a treatment plan by your provider. Treatment often starts with rest and pain relief. It may also include: Physical therapy.  These are exercises to help restore movement and strength to your back. Techniques to help you relax. Counseling or therapy. Cognitive behavioral therapy (CBT) is a form of therapy that helps you set goals and make changes. Acupuncture or massage therapy. Local electrical stimulation. Injections. You may be given medicines to numb an area or relieve pain. If other treatments do not help, you may need surgery. How to use body mechanics and posture to help with pain You can help relieve stress on your back with good posture and healthy body mechanics. Body mechanics are all the ways your body moves during the day. Posture is part of body mechanics. Good posture means: Your spine is in its correct S-curve, or neutral, position. Your shoulders are pulled back a bit. Your head is not tipped forward. To improve your posture and body mechanics, follow these guidelines. Standing  When standing, keep your feet about hip-width apart. Keep your knees slightly bent. Your ears, shoulders, and hips should line up. Your spine should be neutral. When you stand in one place for a long time, place one foot on a stable object that is 2-4 inches (5-10 cm) high, such as a footstool. Sitting  When sitting, keep your feet flat on the floor. Use a footrest, if needed. Keep your thighs parallel to the floor. Try not to round your shoulders or tilt your head forward. When working at a desk or a computer: Position your desk so your hands are a little lower than your elbows. Slide your chair under your desk so you are close enough to have good posture. Position your monitor so you are looking straight ahead and do not have to tilt your head to view the screen. Lifting  Keep your feet shoulder-width apart. Tighten the muscles of your abdomen. Bend your knees and hips. Keep your spine neutral. Lift using the strength of your legs, not your back. Do not lock your knees straight out. Ask for help to lift heavy or  awkward objects. Resting  Do not lie down in a way that causes pain. If you have pain when you sit, bend, stoop,  or squat, lie in a way that your body does not bend much. Try not to curl up on your side with your arms and knees near your chest (fetal position). If it hurts to stand for a long time or reach with your arms, lie with your spine neutral and knees bent slightly. Try lying: On your side with a pillow between your knees. On your back with a pillow under your knees. How to recognize changes in your chronic back pain Let your provider know if your pain gets worse or does not get better with treatment. Your back pain may be getting worse if you have pain that: Starts to cause problems with your posture. Gets worse when you sit, stand, walk, bend, or lift things. Happens when you are active, at rest, or both. Makes it hard for you to move around (limits mobility). Occurs with fever, weight loss, or trouble peeing (urinating). Causes numbness and tingling. Follow these instructions at home: Medicines You may need to take medicines for pain and inflammation. These may be taken by mouth or put on the skin. You may also be given muscle relaxants. Take over-the-counter and prescription medicines only as told by your provider. Ask your provider if the medicine prescribed to you: Requires you to avoid driving or using machinery. Can cause constipation. You may need to take these actions to prevent or treat constipation: Drink enough fluid to keep your pee (urine) pale yellow. Take over-the-counter or prescription medicines. Eat foods that are high in fiber, such as beans, whole grains, and fresh fruits and vegetables. Limit foods that are high in fat and processed sugars, such as fried or sweet foods. Lifestyle Do not use any products that contain nicotine or tobacco. These products include cigarettes, chewing tobacco, and vaping devices, such as e-cigarettes. If you need help quitting,  ask your provider. Eat a healthy diet. Eat lots of vegetables, fruits, fish, and lean meats. Work with your provider to stay at a healthy weight. General instructions Get regular exercise as told. Exercise can help with flexibility and strength. If physical therapy was prescribed, do exercises as told by your provider. Use ice or heat therapy as told by your provider. Where can I get support? Think about joining a support group for people with chronic back pain. You can find some groups at: Pain Connection Program: painconnection.org The American Chronic Pain Association: acpanow.com Contact a health care provider if: Your pain does not get better with rest or medicine. You have new pain. You have a fever. You lose weight quickly. You have trouble doing your normal activities. You feel weak or numb in one or both of your legs or feet. Get help right away if: You are not able to control when you pee or poop. You have severe back pain and: Nausea or vomiting. Pain in your chest or abdomen. Shortness of breath. You faint. These symptoms may be an emergency. Get help right away. Call 911. Do not wait to see if the symptoms will go away. Do not drive yourself to the hospital. This information is not intended to replace advice given to you by your health care provider. Make sure you discuss any questions you have with your health care provider. Document Revised: 04/09/2022 Document Reviewed: 04/09/2022 Elsevier Patient Education  2024 [employer]. Preventing Back Pain for New Parents Back pain is common during pregnancy and often goes away once the baby is born. Sometimes, lifting and carrying a growing baby causes a different kind of  back pain. This type of back pain can affect parents as well as anyone who regularly takes care of a new baby. How can this condition affect me? By the time your baby is 21 year old, you may be lifting and carrying about 20 pounds or more up to 50 times  each day. Feeding your baby and moving your baby in and out of the car seat and stroller take a lot of bending over. This can cause wear and tear on your back. What actions can I take to manage this? Do exercises that strengthen your back. Change the way you do some activities, such as lifting and bending, to help prevent back pain. Stay at a healthy weight. Get regular exercise. Before you start exercising, check with your health care provider. This is especially important for new moms who have recently given birth. Strengthen your back After your health care provider says that it is okay, do exercises to help strengthen your back. Start with a conditioning program to increase flexibility in your hips and back. This will help prevent back stress. Include exercises that strengthen the muscles that support your back (core abdominal muscles). Consider joining a group exercise class in yoga or Pilates. These exercises combine flexibility, muscle strength, and muscle endurance exercises. The instructor can give guidance and support. Ask your health care provider or a physical therapist to recommend other strength and flexibility exercises that would help you to strengthen your back. Lift and carry your baby safely Making adjustments to the way you do certain movements can also help prevent back pain. This includes changing the way you lift your baby. Bend your knees to squat down when you pick up your baby. Do not bend your back. When you lift, use your core and leg muscles, keep your back straight, and lift with your legs. Do not stretch out your arms to lift your baby. This puts extra stress on your back. Bring your baby close to your chest before lifting. Avoid twisting and bending while holding your baby. Avoid holding your baby on your hip. This position puts stress on your back muscles. Instead, hold your baby in front of you so that you are chest to chest. Adjust the way you carry your  baby: If you are walking with your baby for any distance, use a front pack carrier. This type of baby carrier straps around your shoulders and back to spread the baby's weight evenly. Do not carry your baby to or from the car in the car seat. Instead, place the infant car seat in a stroller designed to hold the car seat. Or, place your older baby in the stroller and push the stroller to your destination. Place your baby's car seat in the middle of the back seat if possible. This allows you to sit beside the car seat instead of bending over to take your baby in and out of the car seat. If you cannot sit beside the car seat, kneel on the back seat as you put your baby in or out of the seat. Avoid standing outside and bending and reaching over.   Feed your baby carefully Adjust the way you feed your baby: Do not bend over when breastfeeding or bottle feeding. Put pillows on your lap to position your baby higher so that you can keep your back and neck straight. Sit in a chair when feeding your baby in a high chair. Do not bend over to feed your baby in a high chair. When  taking your baby out of a high chair, remove the feeding tray first and move close to your baby. Pulling your baby out of the high chair without removing the tray first can strain your back. Other ways to prevent back pain To prevent back pain when you are not lifting, feeding, or carrying your baby: Focus on good posture. Try to stand and sit straight. Avoid slouching. Wear low-heeled shoes. Do not use any products that contain nicotine or tobacco. These products include cigarettes, chewing tobacco, and vaping devices, such as e-cigarettes. Smoking reduces blood supply to your back and increases your risk for back pain or injury. If you need help quitting, ask your health care provider. Do stretching and strengthening exercises for your back, legs, hips, and core muscles regularly.   Where to find more information American Academy of  Orthopaedic Surgeons, Preventing Back Pain: Tips for New Moms: orthoinfo.aaos.[facility]th[facility] and Musculoskeletal and Skin Diseases, Back Pain: www.niams.http://www.myers.net/ American Academy of Orthopaedic Surgeons, Spine Conditioning Program: orthoinfo.aaos.org Contact a health care provider if: You have severe back pain. You have back pain when you lift or carry your baby. You have back pain that has lasted several days and does not go away with rest. You have back pain with weakness, numbness, or tingling anywhere on your body. This information is not intended to replace advice given to you by your health care provider. Make sure you discuss any questions you have with your health care provider. Document Revised: 02/20/2022 Document Reviewed: 02/20/2022 Elsevier Patient Education  2024 Elsevier Inc.  The above assessment and management plan was discussed with the patient. The patient verbalized understanding of and has agreed to the management plan. Patient is aware to call the clinic if they develop any new symptoms or if symptoms persist or worsen. Patient is aware when to return to the clinic for a follow-up visit. Patient educated on when it is appropriate to go to the emergency department.   Rossanna Spitzley [place] Thompson, DNP [facility] [address] [place], [place] [address] [phone]

## 2024-05-28 NOTE — Telephone Encounter (Signed)
 FYI Only or Action Required?: FYI only for provider.  Patient was last seen in primary care on 03/25/2024 by Deitra Morton Sebastian Nena, NP.  Called Nurse Triage reporting Hip Pain.  Symptoms began several years ago.  Interventions attempted: Nothing.  Symptoms are: gradually worsening.  Triage Disposition: See PCP When Office is Open (Within 3 Days)  Patient/caregiver understands and will follow disposition?: Yes   Reason for Disposition  [1] MODERATE pain (e.g., interferes with normal activities, limping) AND [2] present > 3 days  Answer Assessment - Initial Assessment Questions 1. LOCATION and RADIATION: Where is the pain located? Does the pain spread (shoot) anywhere else?     No radiation- both hips 2. QUALITY: What does the pain feel like?  (e.g., sharp, dull, aching, burning)     Dull ache 3. SEVERITY: How bad is the pain? What does it keep you from doing?   (Scale 1-10; or mild, moderate, severe)     6-7/10 4. ONSET: When did the pain start? Does it come and go, or is it there all the time?     Few years- 4-5- getting worse over last 2 days 5. WORK OR EXERCISE: Has there been any recent work or exercise that involved this part of the body?      no 6. CAUSE: What do you think is causing the hip pain?      unknown 7. AGGRAVATING FACTORS: What makes the hip pain worse? (e.g., walking, climbing stairs, running)     Moving in general 8. OTHER SYMPTOMS: Do you have any other symptoms? (e.g., back pain, pain shooting down leg,  fever, rash)     no  Protocols used: Hip Pain-A-AH   Copied from CRM #[id number]. Topic: Clinical - Red Word Triage >> May 28, 2024  8:06 AM Miquel SAILOR wrote: Red Word that prompted transfer to Nurse Triage: Both hip pain  2-3 days but getting worse

## 2024-05-28 NOTE — Telephone Encounter (Signed)
 Noted patient has appt today 05/28/24

## 2024-06-02 ENCOUNTER — Ambulatory Visit: Payer: Self-pay | Admitting: Nurse Practitioner
